# Patient Record
Sex: Male | Born: 1975
Health system: Southern US, Community
[De-identification: ages and names within clinical notes are randomized; demographics above are authoritative.]

## PROBLEM LIST (undated history)

## (undated) DIAGNOSIS — K219 Gastro-esophageal reflux disease without esophagitis: Secondary | ICD-10-CM

## (undated) DIAGNOSIS — R0681 Apnea, not elsewhere classified: Secondary | ICD-10-CM

## (undated) DIAGNOSIS — E785 Hyperlipidemia, unspecified: Secondary | ICD-10-CM

## (undated) DIAGNOSIS — E039 Hypothyroidism, unspecified: Secondary | ICD-10-CM

## (undated) DIAGNOSIS — F32A Depression, unspecified: Secondary | ICD-10-CM

## (undated) DIAGNOSIS — F329 Major depressive disorder, single episode, unspecified: Secondary | ICD-10-CM

## (undated) DIAGNOSIS — G4730 Sleep apnea, unspecified: Secondary | ICD-10-CM

## (undated) DIAGNOSIS — E119 Type 2 diabetes mellitus without complications: Secondary | ICD-10-CM

## (undated) DIAGNOSIS — J45909 Unspecified asthma, uncomplicated: Secondary | ICD-10-CM

## (undated) DIAGNOSIS — I10 Essential (primary) hypertension: Secondary | ICD-10-CM

## (undated) DIAGNOSIS — M1990 Unspecified osteoarthritis, unspecified site: Secondary | ICD-10-CM

## (undated) DIAGNOSIS — R011 Cardiac murmur, unspecified: Secondary | ICD-10-CM

## (undated) DIAGNOSIS — Z87442 Personal history of urinary calculi: Secondary | ICD-10-CM

## (undated) HISTORY — PX: CHOLECYSTECTOMY: SHX55

## (undated) HISTORY — DX: Hyperlipidemia, unspecified: E78.5

## (undated) HISTORY — DX: Gastro-esophageal reflux disease without esophagitis: K21.9

## (undated) HISTORY — PX: APPENDECTOMY: SHX54

## (undated) HISTORY — DX: Essential (primary) hypertension: I10

## (undated) HISTORY — DX: Type 2 diabetes mellitus without complications: E11.9

## (undated) HISTORY — DX: Apnea, not elsewhere classified: R06.81

## (undated) HISTORY — PX: OTHER SURGICAL HISTORY: SHX169

## (undated) HISTORY — PX: SMALL INTESTINE SURGERY: SHX150

---

## 2016-11-22 ENCOUNTER — Other Ambulatory Visit (HOSPITAL_COMMUNITY): Payer: Self-pay | Admitting: General Surgery

## 2016-12-13 ENCOUNTER — Ambulatory Visit (HOSPITAL_COMMUNITY)
Admission: RE | Admit: 2016-12-13 | Discharge: 2016-12-13 | Disposition: A | Discharge status: Home / Self Care | Payer: Medicare Other | Source: Ambulatory Visit | Attending: General Surgery | Admitting: General Surgery

## 2016-12-13 DIAGNOSIS — K449 Diaphragmatic hernia without obstruction or gangrene: Secondary | ICD-10-CM | POA: Diagnosis not present

## 2016-12-13 DIAGNOSIS — I4510 Unspecified right bundle-branch block: Secondary | ICD-10-CM | POA: Insufficient documentation

## 2016-12-18 ENCOUNTER — Encounter (INDEPENDENT_AMBULATORY_CARE_PROVIDER_SITE_OTHER): Payer: Self-pay

## 2016-12-18 ENCOUNTER — Encounter: Payer: Medicare Other | Attending: General Surgery | Admitting: Skilled Nursing Facility1

## 2016-12-18 ENCOUNTER — Encounter: Payer: Self-pay | Admitting: Skilled Nursing Facility1

## 2016-12-18 DIAGNOSIS — Z8261 Family history of arthritis: Secondary | ICD-10-CM | POA: Diagnosis not present

## 2016-12-18 DIAGNOSIS — M1990 Unspecified osteoarthritis, unspecified site: Secondary | ICD-10-CM | POA: Diagnosis not present

## 2016-12-18 DIAGNOSIS — Z8349 Family history of other endocrine, nutritional and metabolic diseases: Secondary | ICD-10-CM | POA: Insufficient documentation

## 2016-12-18 DIAGNOSIS — Z87891 Personal history of nicotine dependence: Secondary | ICD-10-CM | POA: Insufficient documentation

## 2016-12-18 DIAGNOSIS — Z836 Family history of other diseases of the respiratory system: Secondary | ICD-10-CM | POA: Diagnosis not present

## 2016-12-18 DIAGNOSIS — E119 Type 2 diabetes mellitus without complications: Secondary | ICD-10-CM | POA: Insufficient documentation

## 2016-12-18 DIAGNOSIS — R011 Cardiac murmur, unspecified: Secondary | ICD-10-CM | POA: Insufficient documentation

## 2016-12-18 DIAGNOSIS — K8020 Calculus of gallbladder without cholecystitis without obstruction: Secondary | ICD-10-CM | POA: Insufficient documentation

## 2016-12-18 DIAGNOSIS — Z8371 Family history of colonic polyps: Secondary | ICD-10-CM | POA: Insufficient documentation

## 2016-12-18 DIAGNOSIS — I10 Essential (primary) hypertension: Secondary | ICD-10-CM | POA: Insufficient documentation

## 2016-12-18 DIAGNOSIS — Z713 Dietary counseling and surveillance: Secondary | ICD-10-CM | POA: Insufficient documentation

## 2016-12-18 DIAGNOSIS — G4730 Sleep apnea, unspecified: Secondary | ICD-10-CM | POA: Insufficient documentation

## 2016-12-18 DIAGNOSIS — Z8249 Family history of ischemic heart disease and other diseases of the circulatory system: Secondary | ICD-10-CM | POA: Diagnosis not present

## 2016-12-18 DIAGNOSIS — N200 Calculus of kidney: Secondary | ICD-10-CM | POA: Insufficient documentation

## 2016-12-18 DIAGNOSIS — Z79899 Other long term (current) drug therapy: Secondary | ICD-10-CM | POA: Insufficient documentation

## 2016-12-18 DIAGNOSIS — E079 Disorder of thyroid, unspecified: Secondary | ICD-10-CM | POA: Insufficient documentation

## 2016-12-18 DIAGNOSIS — J45909 Unspecified asthma, uncomplicated: Secondary | ICD-10-CM | POA: Diagnosis not present

## 2016-12-18 DIAGNOSIS — Z811 Family history of alcohol abuse and dependence: Secondary | ICD-10-CM | POA: Diagnosis not present

## 2016-12-18 DIAGNOSIS — Z6841 Body Mass Index (BMI) 40.0 and over, adult: Secondary | ICD-10-CM | POA: Insufficient documentation

## 2016-12-18 DIAGNOSIS — M549 Dorsalgia, unspecified: Secondary | ICD-10-CM | POA: Diagnosis not present

## 2016-12-18 DIAGNOSIS — K219 Gastro-esophageal reflux disease without esophagitis: Secondary | ICD-10-CM | POA: Diagnosis not present

## 2016-12-18 DIAGNOSIS — F329 Major depressive disorder, single episode, unspecified: Secondary | ICD-10-CM | POA: Diagnosis not present

## 2016-12-18 DIAGNOSIS — K649 Unspecified hemorrhoids: Secondary | ICD-10-CM | POA: Diagnosis not present

## 2016-12-18 DIAGNOSIS — Z818 Family history of other mental and behavioral disorders: Secondary | ICD-10-CM | POA: Insufficient documentation

## 2016-12-18 DIAGNOSIS — Z9049 Acquired absence of other specified parts of digestive tract: Secondary | ICD-10-CM | POA: Insufficient documentation

## 2016-12-18 DIAGNOSIS — E7800 Pure hypercholesterolemia, unspecified: Secondary | ICD-10-CM | POA: Diagnosis not present

## 2016-12-18 DIAGNOSIS — Z9889 Other specified postprocedural states: Secondary | ICD-10-CM | POA: Diagnosis not present

## 2016-12-18 NOTE — Progress Notes (Signed)
Pre-Op Assessment Visit:  Pre-Operative RYGB Surgery  Medical Nutrition Therapy:  Appt start time: 2:03  End time:  2:50  Patient was seen on 12/18/2016 for Pre-Operative Nutrition Assessment. Assessment and letter of approval faxed to [facility] Surgery Bariatric Surgery Program coordinator on 12/18/2016.  Pt states he had tried a no carb diet and got very sick. Pt states he has had diabetes for many years. Pt states he checks his blood sugar and never gets over 120. Pt states he is aware of what low blood sugar feels like. Pt states sometimes he does not eat and gets low blood sugar.Pt states he has OCD.   Start weight at [facility]: 352.1 BMI: 56.83  24 hr Dietary Recall: First Meal: 2-3 eggs, toast, banana Snack:  Second Meal: grilled sandwich and vegetables  Snack: apple Third Meal: vegetables and chicken or tuna Snack:  Beverages: sweet tea, water, coffee  Encouraged to engage in 150 minutes of moderate physical activity including cardiovascular and weight baring weekly  Handouts given during visit include:  . Pre-Op Goals . Bariatric Surgery Protein Shakes During the appointment today the following Pre-Op Goals were reviewed with the patient: . Maintain or lose weight as instructed by your surgeon . Make healthy food choices . Begin to limit portion sizes . Limited concentrated sugars and fried foods . Keep fat/sugar in the single digits per serving on             food labels . Practice CHEWING your food  (aim for 30 chews per bite or until applesauce consistency) . Practice not drinking 15 minutes before, during, and 30 minutes after each meal/snack . Avoid all carbonated beverages  . Avoid/limit caffeinated beverages  . Avoid all sugar-sweetened beverages . Consume 3 meals per day; eat every 3-5 hours . Make a list of non-food related activities . Aim for 64-100 ounces of FLUID daily  . Aim for at least 60-80 grams of PROTEIN daily . Look for a liquid protein source  that contain ?15 g protein and ?5 g carbohydrate  (ex: shakes, drinks, shots)  -Follow diet recommendations listed below   Energy and Macronutrient Recomendations: Calories: 1800 Carbohydrate: 200 Protein: 135 Fat: 50  Demonstrated degree of understanding via:  Teach Back  Teaching Method Utilized:  Visual Auditory Hands on  Barriers to learning/adherence to lifestyle change: none identified   Patient to call the [facility] to enroll in Pre-Op and Post-Op Nutrition Education when surgery date is scheduled.

## 2016-12-18 NOTE — Patient Instructions (Signed)
Follow Pre-Op Goals Try Protein Shakes Call [facility] at [phone] when surgery is scheduled to enroll in Pre-Op Class  Things to remember:  Please always be honest with [place]. We want to support you!  If you have any questions or concerns in between appointments, please call or email   The diet after surgery will be high protein and low in carbohydrate.  Vitamins and calcium need to be taken for the rest of your life.  Feel free to include support people in any classes or appointments.   Supplement recommendations:  Before Surgery   1 Complete Multivitamin with Iron  3000 IU Vitamin D3  After Surgery   2 Chewable Multivitamins  **Best Choice - Opurity: Bypass and Sleeve Optimized Chewable    Bariatric Advantage Advanced Multi EA      3 Chewable Calcium (500 mg each, total 1200-1500 mg per day)  **Best Choice - Celebrate, Bariatric Advantage, or Wellesse  Other Options:  2 Celebrate MultiComplete with 18 mg Iron (this provides 6000 IU of  Vitamin D3)  4 Celebrate Essential Multi 2 in 1 (has calcium) + 18-60 mg separate  iron  Vitamins and Calcium are available at:   [facility]   [address], [place], [place] [address]   [email or web address]  www.[email or web address]  www.amazon.com

## 2017-01-01 ENCOUNTER — Encounter: Payer: Medicare Other | Attending: General Surgery | Admitting: Skilled Nursing Facility1

## 2017-01-01 DIAGNOSIS — R011 Cardiac murmur, unspecified: Secondary | ICD-10-CM | POA: Insufficient documentation

## 2017-01-01 DIAGNOSIS — N200 Calculus of kidney: Secondary | ICD-10-CM | POA: Insufficient documentation

## 2017-01-01 DIAGNOSIS — Z836 Family history of other diseases of the respiratory system: Secondary | ICD-10-CM | POA: Insufficient documentation

## 2017-01-01 DIAGNOSIS — Z818 Family history of other mental and behavioral disorders: Secondary | ICD-10-CM | POA: Diagnosis not present

## 2017-01-01 DIAGNOSIS — M1990 Unspecified osteoarthritis, unspecified site: Secondary | ICD-10-CM | POA: Insufficient documentation

## 2017-01-01 DIAGNOSIS — Z8349 Family history of other endocrine, nutritional and metabolic diseases: Secondary | ICD-10-CM | POA: Insufficient documentation

## 2017-01-01 DIAGNOSIS — Z8249 Family history of ischemic heart disease and other diseases of the circulatory system: Secondary | ICD-10-CM | POA: Diagnosis not present

## 2017-01-01 DIAGNOSIS — K219 Gastro-esophageal reflux disease without esophagitis: Secondary | ICD-10-CM | POA: Diagnosis not present

## 2017-01-01 DIAGNOSIS — Z9049 Acquired absence of other specified parts of digestive tract: Secondary | ICD-10-CM | POA: Diagnosis not present

## 2017-01-01 DIAGNOSIS — G4730 Sleep apnea, unspecified: Secondary | ICD-10-CM | POA: Insufficient documentation

## 2017-01-01 DIAGNOSIS — Z8371 Family history of colonic polyps: Secondary | ICD-10-CM | POA: Diagnosis not present

## 2017-01-01 DIAGNOSIS — J45909 Unspecified asthma, uncomplicated: Secondary | ICD-10-CM | POA: Diagnosis not present

## 2017-01-01 DIAGNOSIS — Z87891 Personal history of nicotine dependence: Secondary | ICD-10-CM | POA: Insufficient documentation

## 2017-01-01 DIAGNOSIS — E079 Disorder of thyroid, unspecified: Secondary | ICD-10-CM | POA: Insufficient documentation

## 2017-01-01 DIAGNOSIS — E119 Type 2 diabetes mellitus without complications: Secondary | ICD-10-CM | POA: Diagnosis not present

## 2017-01-01 DIAGNOSIS — Z9889 Other specified postprocedural states: Secondary | ICD-10-CM | POA: Insufficient documentation

## 2017-01-01 DIAGNOSIS — E7800 Pure hypercholesterolemia, unspecified: Secondary | ICD-10-CM | POA: Diagnosis not present

## 2017-01-01 DIAGNOSIS — Z713 Dietary counseling and surveillance: Secondary | ICD-10-CM | POA: Insufficient documentation

## 2017-01-01 DIAGNOSIS — K649 Unspecified hemorrhoids: Secondary | ICD-10-CM | POA: Diagnosis not present

## 2017-01-01 DIAGNOSIS — Z811 Family history of alcohol abuse and dependence: Secondary | ICD-10-CM | POA: Insufficient documentation

## 2017-01-01 DIAGNOSIS — Z8261 Family history of arthritis: Secondary | ICD-10-CM | POA: Diagnosis not present

## 2017-01-01 DIAGNOSIS — M549 Dorsalgia, unspecified: Secondary | ICD-10-CM | POA: Diagnosis not present

## 2017-01-01 DIAGNOSIS — K8020 Calculus of gallbladder without cholecystitis without obstruction: Secondary | ICD-10-CM | POA: Insufficient documentation

## 2017-01-01 DIAGNOSIS — Z6841 Body Mass Index (BMI) 40.0 and over, adult: Secondary | ICD-10-CM | POA: Insufficient documentation

## 2017-01-01 DIAGNOSIS — I10 Essential (primary) hypertension: Secondary | ICD-10-CM | POA: Diagnosis not present

## 2017-01-01 DIAGNOSIS — Z79899 Other long term (current) drug therapy: Secondary | ICD-10-CM | POA: Insufficient documentation

## 2017-01-01 DIAGNOSIS — F329 Major depressive disorder, single episode, unspecified: Secondary | ICD-10-CM | POA: Insufficient documentation

## 2017-01-01 DIAGNOSIS — E669 Obesity, unspecified: Secondary | ICD-10-CM

## 2017-01-03 ENCOUNTER — Encounter: Payer: Self-pay | Admitting: Skilled Nursing Facility1

## 2017-01-03 NOTE — Progress Notes (Signed)
  Pre-Operative Nutrition Class:  Appt start time: 6333   End time:  1830.  Patient was seen on 01/01/2017 for Pre-Operative Bariatric Surgery Education at [facility].   Surgery date:  Surgery type:  Start weight at [facility]: 353.2lbs Weight today: 349.3lbs  TANITA  BODY COMP RESULTS     BMI (kg/m^2)    Fat Mass (lbs)    Fat Free Mass (lbs)    Total Body Water (lbs)    Samples given per MNT protocol. Patient educated on appropriate usage: Bariatric Advantage Multivitamin Lot # [id number] Exp: 6/19  Celebrate Vitamins Calcium Citrate Lot # [id number] Exp:10/19  Unjury Protein  Lot # [id number] Exp: sep-16-18  The following the learning objectives were met by the patient during this course:  Identify Pre-Op Dietary Goals and will begin 2 weeks pre-operatively  Identify appropriate sources of fluids and proteins   State protein recommendations and appropriate sources pre and post-operatively  Identify Post-Operative Dietary Goals and will follow for 2 weeks post-operatively  Identify appropriate multivitamin and calcium sources  Describe the need for physical activity post-operatively and will follow MD recommendations  State when to call healthcare provider regarding medication questions or post-operative complications  Handouts given during class include:  Pre-Op Bariatric Surgery Diet Handout  Protein Shake Handout  Post-Op Bariatric Surgery Nutrition Handout  BELT Program Information Flyer  Support Group Information Flyer  [facility] Bariatric Supplements Price List  Follow-Up Plan: [facility] per MD.

## 2017-02-05 ENCOUNTER — Ambulatory Visit (INDEPENDENT_AMBULATORY_CARE_PROVIDER_SITE_OTHER): Payer: Medicare Other | Admitting: Psychology

## 2017-02-05 DIAGNOSIS — F3342 Major depressive disorder, recurrent, in full remission: Secondary | ICD-10-CM

## 2017-02-12 ENCOUNTER — Ambulatory Visit (INDEPENDENT_AMBULATORY_CARE_PROVIDER_SITE_OTHER): Payer: Medicare Other | Admitting: Psychology

## 2017-03-02 ENCOUNTER — Ambulatory Visit: Payer: Self-pay | Admitting: General Surgery

## 2017-03-13 NOTE — Patient Instructions (Signed)
DARON STUTZ  03/13/2017   Your procedure is scheduled on: 03/19/2017    Report to [facility] Main  Entrance Take [place]  elevators to 3rd floor to  [place] at    [place] AM.     Call this number if you have problems the morning of surgery [phone]    Remember: ONLY 1 PERSON MAY GO WITH YOU TO SHORT STAY TO GET  READY MORNING OF YOUR SURGERY.  Do not eat food or drink liquids :After Midnight.     Take these medicines the morning of surgery with A SIP OF WATER: Albuterol Inhaler if needed and bring, Wellbutrin, Flonase, Lamictal, synthroid, PRotonix  DO NOT TAKE ANY DIABETIC MEDICATIONS DAY OF YOUR SURGERY                               You may not have any metal on your body including hair pins and              piercings  Do not wear jewelry, make-up, lotions, powders or perfumes, deodorant             Do not wear nail polish.  Do not shave  48 hours prior to surgery.              Men may shave face and neck.   Do not bring valuables to the hospital. [place].  Contacts, dentures or bridgework may not be worn into surgery.  Leave suitcase in the car. After surgery it may be brought to your room.       Special Instructions: coughing and deep breathing exercises, leg exercises               Please read over the following fact sheets you were given: _____________________________________________________________________             [facility] - Preparing for Surgery Before surgery, you can play an important role.  Because skin is not sterile, your skin needs to be as free of germs as possible.  You can reduce the number of germs on your skin by washing with CHG (chlorahexidine gluconate) soap before surgery.  CHG is an antiseptic cleaner which kills germs and bonds with the skin to continue killing germs even after washing. Please DO NOT use if you have an allergy to CHG or antibacterial soaps.   If your skin becomes reddened/irritated stop using the CHG and inform your nurse when you arrive at Short Stay. Do not shave (including legs and underarms) for at least 48 hours prior to the first CHG shower.  You may shave your face/neck. Please follow these instructions carefully:  1.  Shower with CHG Soap the night before surgery and the  morning of Surgery.  2.  If you choose to wash your hair, wash your hair first as usual with your  normal  shampoo.  3.  After you shampoo, rinse your hair and body thoroughly to remove the  shampoo.                           4.  Use CHG as you would any other liquid soap.  You can apply chg directly  to the skin and wash                       Gently with a scrungie or clean washcloth.  5.  Apply the CHG Soap to your body ONLY FROM THE NECK DOWN.   Do not use on face/ open                           Wound or open sores. Avoid contact with eyes, ears mouth and genitals (private parts).                       Wash face,  Genitals (private parts) with your normal soap.             6.  Wash thoroughly, paying special attention to the area where your surgery  will be performed.  7.  Thoroughly rinse your body with warm water from the neck down.  8.  DO NOT shower/wash with your normal soap after using and rinsing off  the CHG Soap.                9.  Pat yourself dry with a clean towel.            10.  Wear clean pajamas.            11.  Place clean sheets on your bed the night of your first shower and do not  sleep with pets. Day of Surgery : Do not apply any lotions/deodorants the morning of surgery.  Please wear clean clothes to the hospital/surgery center.  FAILURE TO FOLLOW THESE INSTRUCTIONS MAY RESULT IN THE CANCELLATION OF YOUR SURGERY PATIENT SIGNATURE_________________________________  NURSE SIGNATURE__________________________________  ________________________________________________________________________

## 2017-03-15 ENCOUNTER — Encounter (HOSPITAL_COMMUNITY): Payer: Self-pay

## 2017-03-15 ENCOUNTER — Encounter (HOSPITAL_COMMUNITY)
Admission: RE | Admit: 2017-03-15 | Discharge: 2017-03-15 | Disposition: A | Discharge status: Home / Self Care | Payer: Medicare Other | Source: Ambulatory Visit | Attending: General Surgery | Admitting: General Surgery

## 2017-03-15 DIAGNOSIS — Z01812 Encounter for preprocedural laboratory examination: Secondary | ICD-10-CM | POA: Diagnosis present

## 2017-03-15 DIAGNOSIS — E119 Type 2 diabetes mellitus without complications: Secondary | ICD-10-CM | POA: Insufficient documentation

## 2017-03-15 HISTORY — DX: Unspecified asthma, uncomplicated: J45.909

## 2017-03-15 HISTORY — DX: Depression, unspecified: F32.A

## 2017-03-15 HISTORY — DX: Major depressive disorder, single episode, unspecified: F32.9

## 2017-03-15 HISTORY — DX: Hypothyroidism, unspecified: E03.9

## 2017-03-15 HISTORY — DX: Personal history of urinary calculi: Z87.442

## 2017-03-15 HISTORY — DX: Unspecified osteoarthritis, unspecified site: M19.90

## 2017-03-15 HISTORY — DX: Cardiac murmur, unspecified: R01.1

## 2017-03-15 HISTORY — DX: Sleep apnea, unspecified: G47.30

## 2017-03-15 LAB — COMPREHENSIVE METABOLIC PANEL
ALK PHOS: 91 U/L (ref 38–126)
ALT: 25 U/L (ref 17–63)
AST: 26 U/L (ref 15–41)
Albumin: 4 g/dL (ref 3.5–5.0)
Anion gap: 8 (ref 5–15)
BILIRUBIN TOTAL: 0.2 mg/dL — AB (ref 0.3–1.2)
BUN: 20 mg/dL (ref 6–20)
CALCIUM: 9.3 mg/dL (ref 8.9–10.3)
CO2: 31 mmol/L (ref 22–32)
CREATININE: 0.95 mg/dL (ref 0.61–1.24)
Chloride: 100 mmol/L — ABNORMAL LOW (ref 101–111)
Glucose, Bld: 128 mg/dL — ABNORMAL HIGH (ref 65–99)
Potassium: 4.7 mmol/L (ref 3.5–5.1)
Sodium: 139 mmol/L (ref 135–145)
Total Protein: 7.7 g/dL (ref 6.5–8.1)

## 2017-03-15 LAB — CBC WITH DIFFERENTIAL/PLATELET
Basophils Absolute: 0 10*3/uL (ref 0.0–0.1)
Basophils Relative: 0 %
EOS PCT: 2 %
Eosinophils Absolute: 0.2 10*3/uL (ref 0.0–0.7)
HEMATOCRIT: 38 % — AB (ref 39.0–52.0)
HEMOGLOBIN: 11.8 g/dL — AB (ref 13.0–17.0)
LYMPHS PCT: 26 %
Lymphs Abs: 3.4 10*3/uL (ref 0.7–4.0)
MCH: 22.3 pg — AB (ref 26.0–34.0)
MCHC: 31.1 g/dL (ref 30.0–36.0)
MCV: 72 fL — AB (ref 78.0–100.0)
Monocytes Absolute: 0.9 10*3/uL (ref 0.1–1.0)
Monocytes Relative: 7 %
NEUTROS ABS: 8.3 10*3/uL — AB (ref 1.7–7.7)
Neutrophils Relative %: 65 %
Platelets: 405 10*3/uL — ABNORMAL HIGH (ref 150–400)
RBC: 5.28 MIL/uL (ref 4.22–5.81)
RDW: 17.5 % — ABNORMAL HIGH (ref 11.5–15.5)
WBC: 12.9 10*3/uL — AB (ref 4.0–10.5)

## 2017-03-15 LAB — GLUCOSE, CAPILLARY: Glucose-Capillary: 131 mg/dL — ABNORMAL HIGH (ref 65–99)

## 2017-03-15 MED FILL — oxyCODONE HCL 5 MG/5ML SOLN: 5 | 4 days supply | Qty: 120 | Fill #0

## 2017-03-15 NOTE — Progress Notes (Signed)
CBC done 6/21/8 faxed via epic to Dr Lorraine [email or web address].r

## 2017-03-15 NOTE — Progress Notes (Signed)
EKG-12/13/16 in epic  and CXR 12/13/16-epic

## 2017-03-16 LAB — HEMOGLOBIN A1C
Hgb A1c MFr Bld: 7.6 % — ABNORMAL HIGH (ref 4.8–5.6)
MEAN PLASMA GLUCOSE: 171 mg/dL

## 2017-03-18 NOTE — Anesthesia Preprocedure Evaluation (Addendum)
Anesthesia Evaluation  Patient identified by MRN, date of birth, ID band Patient awake    Reviewed: Allergy & Precautions, H&P , Patient's Chart, lab work & pertinent test results, reviewed documented beta blocker date and time   Airway Mallampati: IV  TM Distance: >3 FB Neck ROM: full    Dental no notable dental hx.    Pulmonary former smoker,    Pulmonary exam normal breath sounds clear to auscultation       Cardiovascular hypertension,  Rhythm:regular Rate:Normal     Neuro/Psych    GI/Hepatic   Endo/Other  diabetes  Renal/GU      Musculoskeletal   Abdominal   Peds  Hematology   Anesthesia Other Findings GERD (gastroesophageal reflux disease)   Hyperlipidemia   Hypertension   Apnea   Heart murmur  as a child   Sleep apnea  cpap - setting at 17 for pressure  History of kidney stones    Asthma   Hypothyroidism    Diabetes mellitus type II  Depression    Arthritis IRBBB      Reproductive/Obstetrics                            Anesthesia Physical Anesthesia Plan  ASA: III  Anesthesia Plan: General   Post-op Pain Management:    Induction: Intravenous  PONV Risk Score and Plan: 1 and Ondansetron and Dexamethasone  Airway Management Planned: Oral ETT and Video Laryngoscope Planned  Additional Equipment:   Intra-op Plan:   Post-operative Plan: Extubation in OR  Informed Consent: I have reviewed the patients History and Physical, chart, labs and discussed the procedure including the risks, benefits and alternatives for the proposed anesthesia with the patient or authorized representative who has indicated his/her understanding and acceptance.   Dental Advisory Given  Plan Discussed with: CRNA and Surgeon  Anesthesia Plan Comments: (  )      Anesthesia Quick Evaluation

## 2017-03-19 ENCOUNTER — Inpatient Hospital Stay (HOSPITAL_COMMUNITY)
Admission: RE | Admit: 2017-03-19 | Discharge: 2017-03-20 | DRG: 621 | Disposition: A | Discharge status: Home / Self Care | Payer: Medicare Other | Source: Ambulatory Visit | Attending: General Surgery | Admitting: General Surgery

## 2017-03-19 ENCOUNTER — Inpatient Hospital Stay (HOSPITAL_COMMUNITY): Payer: Medicare Other | Admitting: Anesthesiology

## 2017-03-19 ENCOUNTER — Encounter (HOSPITAL_COMMUNITY): Payer: Self-pay | Admitting: *Deleted

## 2017-03-19 ENCOUNTER — Encounter (HOSPITAL_COMMUNITY)
Admission: RE | Disposition: A | Discharge status: Home / Self Care | Payer: Self-pay | Source: Ambulatory Visit | Attending: General Surgery

## 2017-03-19 DIAGNOSIS — Z87891 Personal history of nicotine dependence: Secondary | ICD-10-CM

## 2017-03-19 DIAGNOSIS — E039 Hypothyroidism, unspecified: Secondary | ICD-10-CM | POA: Diagnosis present

## 2017-03-19 DIAGNOSIS — Z7984 Long term (current) use of oral hypoglycemic drugs: Secondary | ICD-10-CM | POA: Diagnosis not present

## 2017-03-19 DIAGNOSIS — J45909 Unspecified asthma, uncomplicated: Secondary | ICD-10-CM | POA: Diagnosis present

## 2017-03-19 DIAGNOSIS — Z79899 Other long term (current) drug therapy: Secondary | ICD-10-CM

## 2017-03-19 DIAGNOSIS — K219 Gastro-esophageal reflux disease without esophagitis: Secondary | ICD-10-CM | POA: Diagnosis present

## 2017-03-19 DIAGNOSIS — Z9989 Dependence on other enabling machines and devices: Secondary | ICD-10-CM

## 2017-03-19 DIAGNOSIS — E785 Hyperlipidemia, unspecified: Secondary | ICD-10-CM | POA: Diagnosis present

## 2017-03-19 DIAGNOSIS — G4730 Sleep apnea, unspecified: Secondary | ICD-10-CM | POA: Diagnosis present

## 2017-03-19 DIAGNOSIS — Z91040 Latex allergy status: Secondary | ICD-10-CM | POA: Diagnosis not present

## 2017-03-19 DIAGNOSIS — M1990 Unspecified osteoarthritis, unspecified site: Secondary | ICD-10-CM | POA: Diagnosis present

## 2017-03-19 DIAGNOSIS — I4510 Unspecified right bundle-branch block: Secondary | ICD-10-CM | POA: Diagnosis present

## 2017-03-19 DIAGNOSIS — F329 Major depressive disorder, single episode, unspecified: Secondary | ICD-10-CM | POA: Diagnosis present

## 2017-03-19 DIAGNOSIS — I10 Essential (primary) hypertension: Secondary | ICD-10-CM | POA: Diagnosis present

## 2017-03-19 DIAGNOSIS — Z886 Allergy status to analgesic agent status: Secondary | ICD-10-CM

## 2017-03-19 DIAGNOSIS — Z881 Allergy status to other antibiotic agents status: Secondary | ICD-10-CM | POA: Diagnosis not present

## 2017-03-19 DIAGNOSIS — Z6841 Body Mass Index (BMI) 40.0 and over, adult: Secondary | ICD-10-CM | POA: Diagnosis not present

## 2017-03-19 DIAGNOSIS — K449 Diaphragmatic hernia without obstruction or gangrene: Secondary | ICD-10-CM | POA: Diagnosis present

## 2017-03-19 DIAGNOSIS — E119 Type 2 diabetes mellitus without complications: Secondary | ICD-10-CM | POA: Diagnosis present

## 2017-03-19 HISTORY — PX: GASTRIC ROUX-EN-Y: SHX5262

## 2017-03-19 LAB — GLUCOSE, CAPILLARY
GLUCOSE-CAPILLARY: 214 mg/dL — AB (ref 65–99)
Glucose-Capillary: 141 mg/dL — ABNORMAL HIGH (ref 65–99)
Glucose-Capillary: 176 mg/dL — ABNORMAL HIGH (ref 65–99)

## 2017-03-19 LAB — HEMOGLOBIN AND HEMATOCRIT, BLOOD
HEMATOCRIT: 39.1 % (ref 39.0–52.0)
HEMOGLOBIN: 12 g/dL — AB (ref 13.0–17.0)

## 2017-03-19 SURGERY — LAPAROSCOPIC ROUX-EN-Y GASTRIC BYPASS WITH UPPER ENDOSCOPY
Anesthesia: General | Site: Abdomen

## 2017-03-19 MED ORDER — HEPARIN SODIUM (PORCINE) 5000 UNIT/ML IJ SOLN
5000.0000 [IU] | INTRAMUSCULAR | Status: AC
  Administered 2017-03-19: 5000 [IU] via SUBCUTANEOUS
  Filled 2017-03-19: qty 1

## 2017-03-19 MED ORDER — LACTATED RINGERS IV SOLN
INTRAVENOUS | Status: DC | PRN
  Administered 2017-03-19 (×3): via INTRAVENOUS

## 2017-03-19 MED ORDER — FENTANYL CITRATE (PF) 100 MCG/2ML IJ SOLN
INTRAMUSCULAR | Status: AC
  Administered 2017-03-19: 50 ug via INTRAVENOUS
  Filled 2017-03-19: qty 2

## 2017-03-19 MED ORDER — PANTOPRAZOLE SODIUM 40 MG IV SOLR
40.0000 mg | Freq: Every day | INTRAVENOUS | Status: DC
  Administered 2017-03-19: 40 mg via INTRAVENOUS
  Filled 2017-03-19: qty 40

## 2017-03-19 MED ORDER — ENOXAPARIN SODIUM 30 MG/0.3ML ~~LOC~~ SOLN
30.0000 mg | Freq: Two times a day (BID) | SUBCUTANEOUS | Status: DC
  Administered 2017-03-20: 30 mg via SUBCUTANEOUS
  Filled 2017-03-19: qty 0.3

## 2017-03-19 MED ORDER — BUPIVACAINE-EPINEPHRINE (PF) 0.25% -1:200000 IJ SOLN
INTRAMUSCULAR | Status: AC
  Filled 2017-03-19: qty 30

## 2017-03-19 MED ORDER — LABETALOL HCL 5 MG/ML IV SOLN
INTRAVENOUS | Status: AC
  Administered 2017-03-19: 10 mg via INTRAVENOUS
  Filled 2017-03-19: qty 4

## 2017-03-19 MED ORDER — SUGAMMADEX SODIUM 500 MG/5ML IV SOLN
INTRAVENOUS | Status: AC
  Filled 2017-03-19: qty 5

## 2017-03-19 MED ORDER — PHENYLEPHRINE HCL 10 MG/ML IJ SOLN
INTRAMUSCULAR | Status: DC | PRN
  Administered 2017-03-19: 240 ug via INTRAVENOUS
  Administered 2017-03-19: 200 ug via INTRAVENOUS

## 2017-03-19 MED ORDER — ONDANSETRON HCL 4 MG/2ML IJ SOLN: 4.0000 mg | INTRAMUSCULAR | Status: DC | PRN

## 2017-03-19 MED ORDER — BUPIVACAINE HCL 0.25 % IJ SOLN
INTRAMUSCULAR | Status: DC | PRN
  Administered 2017-03-19: 30 mL

## 2017-03-19 MED ORDER — PHENYLEPHRINE 40 MCG/ML (10ML) SYRINGE FOR IV PUSH (FOR BLOOD PRESSURE SUPPORT)
PREFILLED_SYRINGE | INTRAVENOUS | Status: DC | PRN
  Administered 2017-03-19 (×3): 80 ug via INTRAVENOUS

## 2017-03-19 MED ORDER — CHLORHEXIDINE GLUCONATE 4 % EX LIQD: 60.0000 mL | Freq: Once | CUTANEOUS | Status: DC

## 2017-03-19 MED ORDER — LIDOCAINE 2% (20 MG/ML) 5 ML SYRINGE
INTRAMUSCULAR | Status: DC | PRN
  Administered 2017-03-19: 1.5 mg/kg/h via INTRAVENOUS

## 2017-03-19 MED ORDER — BUPIVACAINE LIPOSOME 1.3 % IJ SUSP
Freq: Once | INTRAMUSCULAR | Status: AC
  Administered 2017-03-19: 20 mL
  Filled 2017-03-19: qty 20

## 2017-03-19 MED ORDER — INSULIN ASPART 100 UNIT/ML ~~LOC~~ SOLN
0.0000 [IU] | Freq: Three times a day (TID) | SUBCUTANEOUS | Status: DC
  Administered 2017-03-20: 3 [IU] via SUBCUTANEOUS

## 2017-03-19 MED ORDER — ALBUTEROL SULFATE (2.5 MG/3ML) 0.083% IN NEBU
3.0000 mL | INHALATION_SOLUTION | Freq: Four times a day (QID) | RESPIRATORY_TRACT | Status: DC | PRN
Start: 2017-03-19 — End: 2017-03-20

## 2017-03-19 MED ORDER — KETAMINE HCL 10 MG/ML IJ SOLN
INTRAMUSCULAR | Status: DC | PRN
  Administered 2017-03-19: 40 mg via INTRAVENOUS

## 2017-03-19 MED ORDER — FENTANYL CITRATE (PF) 100 MCG/2ML IJ SOLN: 25.0000 ug | INTRAMUSCULAR | Status: DC | PRN

## 2017-03-19 MED ORDER — LIDOCAINE 2% (20 MG/ML) 5 ML SYRINGE
INTRAMUSCULAR | Status: AC
  Filled 2017-03-19: qty 15

## 2017-03-19 MED ORDER — PROPOFOL 10 MG/ML IV BOLUS
INTRAVENOUS | Status: AC
  Filled 2017-03-19: qty 40

## 2017-03-19 MED ORDER — PROPOFOL 10 MG/ML IV BOLUS
INTRAVENOUS | Status: DC | PRN
  Administered 2017-03-19: 200 mg via INTRAVENOUS

## 2017-03-19 MED ORDER — CEFOTETAN DISODIUM-DEXTROSE 2-2.08 GM-% IV SOLR
INTRAVENOUS | Status: AC
  Filled 2017-03-19: qty 50

## 2017-03-19 MED ORDER — FENTANYL CITRATE (PF) 250 MCG/5ML IJ SOLN
INTRAMUSCULAR | Status: AC
  Filled 2017-03-19: qty 5

## 2017-03-19 MED ORDER — GABAPENTIN 300 MG PO CAPS
300.0000 mg | ORAL_CAPSULE | ORAL | Status: AC
  Administered 2017-03-19: 300 mg via ORAL
  Filled 2017-03-19: qty 1

## 2017-03-19 MED ORDER — MORPHINE SULFATE (PF) 2 MG/ML IV SOLN
1.0000 mg | INTRAVENOUS | Status: DC | PRN
  Administered 2017-03-19: 2 mg via INTRAVENOUS
  Administered 2017-03-19 (×2): 3 mg via INTRAVENOUS
  Administered 2017-03-19: 1 mg via INTRAVENOUS
  Filled 2017-03-19 (×2): qty 1
  Filled 2017-03-19 (×2): qty 2

## 2017-03-19 MED ORDER — TRAMADOL HCL 50 MG PO TABS: 50.0000 mg | ORAL_TABLET | Freq: Four times a day (QID) | ORAL | Status: DC | PRN

## 2017-03-19 MED ORDER — OXYCODONE HCL 5 MG/5ML PO SOLN
5.0000 mg | ORAL | Status: DC | PRN
  Administered 2017-03-19: 5 mg via ORAL
  Administered 2017-03-20 (×4): 10 mg via ORAL
  Filled 2017-03-19 (×2): qty 10
  Filled 2017-03-19: qty 5
  Filled 2017-03-19 (×2): qty 10

## 2017-03-19 MED ORDER — SUGAMMADEX SODIUM 200 MG/2ML IV SOLN
INTRAVENOUS | Status: DC | PRN
Start: 2017-03-19 — End: 2017-03-19
  Administered 2017-03-19: 500 mg via INTRAVENOUS

## 2017-03-19 MED ORDER — MIDAZOLAM HCL 2 MG/2ML IJ SOLN
INTRAMUSCULAR | Status: AC
Start: 2017-03-19 — End: 2017-03-19
  Filled 2017-03-19: qty 2

## 2017-03-19 MED ORDER — ROCURONIUM BROMIDE 50 MG/5ML IV SOSY
PREFILLED_SYRINGE | INTRAVENOUS | Status: AC
  Filled 2017-03-19: qty 5

## 2017-03-19 MED ORDER — EPHEDRINE SULFATE 50 MG/ML IJ SOLN
INTRAMUSCULAR | Status: DC | PRN
  Administered 2017-03-19: 15 mg via INTRAVENOUS
  Administered 2017-03-19: 20 mg via INTRAVENOUS

## 2017-03-19 MED ORDER — PHENYLEPHRINE 40 MCG/ML (10ML) SYRINGE FOR IV PUSH (FOR BLOOD PRESSURE SUPPORT)
PREFILLED_SYRINGE | INTRAVENOUS | Status: AC
  Filled 2017-03-19: qty 10

## 2017-03-19 MED ORDER — FENTANYL CITRATE (PF) 250 MCG/5ML IJ SOLN
INTRAMUSCULAR | Status: DC | PRN
  Administered 2017-03-19: 150 ug via INTRAVENOUS
  Administered 2017-03-19: 100 ug via INTRAVENOUS
  Administered 2017-03-19 (×3): 50 ug via INTRAVENOUS

## 2017-03-19 MED ORDER — ONDANSETRON HCL 4 MG/2ML IJ SOLN
INTRAMUSCULAR | Status: AC
  Filled 2017-03-19: qty 2

## 2017-03-19 MED ORDER — EPHEDRINE 5 MG/ML INJ
INTRAVENOUS | Status: AC
  Filled 2017-03-19: qty 10

## 2017-03-19 MED ORDER — ACETAMINOPHEN 500 MG PO TABS
1000.0000 mg | ORAL_TABLET | ORAL | Status: AC
  Administered 2017-03-19: 1000 mg via ORAL
  Filled 2017-03-19: qty 2

## 2017-03-19 MED ORDER — ROCURONIUM BROMIDE 10 MG/ML (PF) SYRINGE
PREFILLED_SYRINGE | INTRAVENOUS | Status: DC | PRN
  Administered 2017-03-19: 20 mg via INTRAVENOUS
  Administered 2017-03-19: 10 mg via INTRAVENOUS
  Administered 2017-03-19: 20 mg via INTRAVENOUS
  Administered 2017-03-19: 50 mg via INTRAVENOUS
  Administered 2017-03-19: 20 mg via INTRAVENOUS

## 2017-03-19 MED ORDER — 0.9 % SODIUM CHLORIDE (POUR BTL) OPTIME
TOPICAL | Status: DC | PRN
  Administered 2017-03-19: 1000 mL

## 2017-03-19 MED ORDER — LACTATED RINGERS IR SOLN
Status: DC | PRN
  Administered 2017-03-19: 1000 mL

## 2017-03-19 MED ORDER — FENTANYL CITRATE (PF) 100 MCG/2ML IJ SOLN
25.0000 ug | INTRAMUSCULAR | Status: DC | PRN
  Administered 2017-03-19 (×2): 25 ug via INTRAVENOUS
  Administered 2017-03-19 (×3): 50 ug via INTRAVENOUS

## 2017-03-19 MED ORDER — ACETAMINOPHEN 160 MG/5ML PO SOLN: 325.0000 mg | ORAL | Status: DC | PRN

## 2017-03-19 MED ORDER — ONDANSETRON HCL 4 MG/2ML IJ SOLN
INTRAMUSCULAR | Status: DC | PRN
  Administered 2017-03-19: 4 mg via INTRAVENOUS

## 2017-03-19 MED ORDER — ACETAMINOPHEN 325 MG PO TABS: 650.0000 mg | ORAL_TABLET | ORAL | Status: DC | PRN

## 2017-03-19 MED ORDER — SIMETHICONE 80 MG PO CHEW: 80.0000 mg | CHEWABLE_TABLET | Freq: Four times a day (QID) | ORAL | Status: DC | PRN

## 2017-03-19 MED ORDER — PREMIER PROTEIN SHAKE: 2.0000 [oz_av] | ORAL | Status: DC

## 2017-03-19 MED ORDER — DEXAMETHASONE SODIUM PHOSPHATE 4 MG/ML IJ SOLN
4.0000 mg | INTRAMUSCULAR | Status: AC
  Administered 2017-03-19: 8 mg via INTRAVENOUS

## 2017-03-19 MED ORDER — CEFOTETAN DISODIUM-DEXTROSE 2-2.08 GM-% IV SOLR
2.0000 g | INTRAVENOUS | Status: AC
  Administered 2017-03-19: 2 g via INTRAVENOUS

## 2017-03-19 MED ORDER — APREPITANT 40 MG PO CAPS
40.0000 mg | ORAL_CAPSULE | ORAL | Status: AC
  Administered 2017-03-19: 40 mg via ORAL
  Filled 2017-03-19: qty 1

## 2017-03-19 MED ORDER — LIDOCAINE 2% (20 MG/ML) 5 ML SYRINGE
INTRAMUSCULAR | Status: AC
  Filled 2017-03-19: qty 5

## 2017-03-19 MED ORDER — FENTANYL CITRATE (PF) 100 MCG/2ML IJ SOLN
INTRAMUSCULAR | Status: AC
  Administered 2017-03-19: 25 ug via INTRAVENOUS
  Filled 2017-03-19: qty 2

## 2017-03-19 MED ORDER — SCOPOLAMINE 1 MG/3DAYS TD PT72
1.0000 | MEDICATED_PATCH | TRANSDERMAL | Status: DC
  Administered 2017-03-19: 1.5 mg via TRANSDERMAL
  Filled 2017-03-19: qty 1

## 2017-03-19 MED ORDER — KETAMINE HCL 10 MG/ML IJ SOLN
INTRAMUSCULAR | Status: AC
  Filled 2017-03-19: qty 1

## 2017-03-19 MED ORDER — MIDAZOLAM HCL 5 MG/5ML IJ SOLN
INTRAMUSCULAR | Status: DC | PRN
  Administered 2017-03-19: 2 mg via INTRAVENOUS

## 2017-03-19 MED ORDER — DEXAMETHASONE SODIUM PHOSPHATE 10 MG/ML IJ SOLN
INTRAMUSCULAR | Status: AC
  Filled 2017-03-19: qty 1

## 2017-03-19 MED ORDER — ROCURONIUM BROMIDE 50 MG/5ML IV SOSY
PREFILLED_SYRINGE | INTRAVENOUS | Status: AC
  Filled 2017-03-19: qty 10

## 2017-03-19 MED ORDER — LABETALOL HCL 5 MG/ML IV SOLN
10.0000 mg | INTRAVENOUS | Status: DC | PRN
  Administered 2017-03-19: 10 mg via INTRAVENOUS
  Filled 2017-03-19: qty 4

## 2017-03-19 MED ORDER — SODIUM CHLORIDE 0.9 % IV SOLN
INTRAVENOUS | Status: DC
  Administered 2017-03-19: 15:00:00 via INTRAVENOUS

## 2017-03-19 MED ORDER — CELECOXIB 200 MG PO CAPS
400.0000 mg | ORAL_CAPSULE | ORAL | Status: AC
  Administered 2017-03-19: 400 mg via ORAL
  Filled 2017-03-19: qty 2

## 2017-03-19 MED ORDER — METOPROLOL TARTRATE 5 MG/5ML IV SOLN: 5.0000 mg | Freq: Four times a day (QID) | INTRAVENOUS | Status: DC | PRN

## 2017-03-19 SURGICAL SUPPLY — 71 items
APPLIER CLIP 5 13 M/L LIGAMAX5 (MISCELLANEOUS)
APPLIER CLIP ROT 10 11.4 M/L (STAPLE)
APPLIER CLIP ROT 13.4 12 LRG (CLIP) ×3
BANDAGE ADH SHEER 1  50/CT (GAUZE/BANDAGES/DRESSINGS) IMPLANT
BENZOIN TINCTURE PRP APPL 2/3 (GAUZE/BANDAGES/DRESSINGS) IMPLANT
BLADE SURG SZ11 CARB STEEL (BLADE) ×3 IMPLANT
CABLE HIGH FREQUENCY MONO STRZ (ELECTRODE) ×3 IMPLANT
CHLORAPREP W/TINT 26ML (MISCELLANEOUS) ×3 IMPLANT
CLIP APPLIE 5 13 M/L LIGAMAX5 (MISCELLANEOUS) IMPLANT
CLIP APPLIE ROT 10 11.4 M/L (STAPLE) IMPLANT
CLIP APPLIE ROT 13.4 12 LRG (CLIP) ×1 IMPLANT
CLOSURE WOUND 1/2 X4 (GAUZE/BANDAGES/DRESSINGS)
COVER SURGICAL LIGHT HANDLE (MISCELLANEOUS) ×3 IMPLANT
DEVICE SUTURE ENDOST 10MM (ENDOMECHANICALS) ×3 IMPLANT
DRAIN CHANNEL 19F RND (DRAIN) IMPLANT
DRAIN PENROSE 18X1/4 LTX STRL (WOUND CARE) ×3 IMPLANT
DRAIN PENROSE LF 8X20.3CM SIL (WOUND CARE) ×3 IMPLANT
ELECT L-HOOK LAP 45CM DISP (ELECTROSURGICAL) ×3
ELECT PENCIL ROCKER SW 15FT (MISCELLANEOUS) ×3 IMPLANT
ELECTRODE L-HOOK LAP 45CM DISP (ELECTROSURGICAL) ×1 IMPLANT
EVACUATOR SILICONE 100CC (DRAIN) IMPLANT
GAUZE SPONGE 4X4 12PLY STRL (GAUZE/BANDAGES/DRESSINGS) IMPLANT
GAUZE SPONGE 4X4 16PLY XRAY LF (GAUZE/BANDAGES/DRESSINGS) ×3 IMPLANT
GLOVE BIOGEL PI IND STRL 7.0 (GLOVE) ×1 IMPLANT
GLOVE BIOGEL PI INDICATOR 7.0 (GLOVE) ×2
GLOVE SURG SS PI 7.0 STRL IVOR (GLOVE) ×3 IMPLANT
GOWN STRL REUS W/TWL LRG LVL3 (GOWN DISPOSABLE) ×3 IMPLANT
GOWN STRL REUS W/TWL XL LVL3 (GOWN DISPOSABLE) ×9 IMPLANT
HANDLE STAPLE EGIA 4 XL (STAPLE) ×3 IMPLANT
HOLDER FOLEY CATH W/STRAP (MISCELLANEOUS) ×3 IMPLANT
HOVERMATT SINGLE USE (MISCELLANEOUS) ×3 IMPLANT
IRRIG SUCT STRYKERFLOW 2 WTIP (MISCELLANEOUS)
IRRIGATION SUCT STRKRFLW 2 WTP (MISCELLANEOUS) IMPLANT
KIT BASIN OR (CUSTOM PROCEDURE TRAY) ×3 IMPLANT
KIT GASTRIC LAVAGE 34FR ADT (SET/KITS/TRAYS/PACK) ×3 IMPLANT
MARKER SKIN DUAL TIP RULER LAB (MISCELLANEOUS) ×3 IMPLANT
NEEDLE SPNL 22GX3.5 QUINCKE BK (NEEDLE) ×3 IMPLANT
PACK CARDIOVASCULAR III (CUSTOM PROCEDURE TRAY) ×3 IMPLANT
RELOAD EGIA 45 MED/THCK PURPLE (STAPLE) ×3 IMPLANT
RELOAD EGIA 45 TAN VASC (STAPLE) IMPLANT
RELOAD EGIA 60 MED/THCK PURPLE (STAPLE) ×9 IMPLANT
RELOAD EGIA 60 TAN VASC (STAPLE) ×15 IMPLANT
RELOAD ENDO STITCH 2.0 (ENDOMECHANICALS) ×24
SCISSORS LAP 5X45 EPIX DISP (ENDOMECHANICALS) ×3 IMPLANT
SHEARS HARMONIC ACE PLUS 45CM (MISCELLANEOUS) ×3 IMPLANT
SLEEVE XCEL OPT CAN 5 100 (ENDOMECHANICALS) ×9 IMPLANT
SOLUTION ANTI FOG 6CC (MISCELLANEOUS) ×3 IMPLANT
STAPLER VISISTAT 35W (STAPLE) IMPLANT
STRIP CLOSURE SKIN 1/2X4 (GAUZE/BANDAGES/DRESSINGS) IMPLANT
SUT ETHILON 2 0 PS N (SUTURE) IMPLANT
SUT MNCRL AB 4-0 PS2 18 (SUTURE) ×6 IMPLANT
SUT RELOAD ENDO STITCH 2 48X1 (ENDOMECHANICALS) ×6
SUT RELOAD ENDO STITCH 2.0 (ENDOMECHANICALS) ×6
SUT SILK 0 SH 30 (SUTURE) IMPLANT
SUTURE RELOAD END STTCH 2 48X1 (ENDOMECHANICALS) ×6 IMPLANT
SUTURE RELOAD ENDO STITCH 2.0 (ENDOMECHANICALS) ×6 IMPLANT
SYR 20CC LL (SYRINGE) ×3 IMPLANT
SYR 50ML LL SCALE MARK (SYRINGE) ×3 IMPLANT
TOWEL OR 17X26 10 PK STRL BLUE (TOWEL DISPOSABLE) ×3 IMPLANT
TOWEL OR NON WOVEN STRL DISP B (DISPOSABLE) ×3 IMPLANT
TRAY FOLEY BAG SILVER LF 16FR (CATHETERS) ×3 IMPLANT
TRAY FOLEY W/METER SILVER 14FR (SET/KITS/TRAYS/PACK) IMPLANT
TRAY FOLEY W/METER SILVER 16FR (SET/KITS/TRAYS/PACK) IMPLANT
TROCAR BLADELESS OPT 5 100 (ENDOMECHANICALS) ×3 IMPLANT
TROCAR XCEL 12X100 BLDLESS (ENDOMECHANICALS) ×3 IMPLANT
TUBE CALIBRATION LAPBAND (TUBING) ×3 IMPLANT
TUBING CONNECTING 10 (TUBING) ×2 IMPLANT
TUBING CONNECTING 10' (TUBING) ×1
TUBING ENDO SMARTCAP PENTAX (MISCELLANEOUS) ×3 IMPLANT
TUBING INSUF HEATED (TUBING) ×3 IMPLANT
TUBING INSUFFLATION 10FT LAP (TUBING) ×3 IMPLANT

## 2017-03-19 NOTE — Transfer of Care (Signed)
Immediate Anesthesia Transfer of Care Note  Patient: Timothy Olson  Procedure(s) Performed: Procedure(s): LAPAROSCOPIC ROUX-EN-Y GASTRIC BYPASS WITH UPPER ENDOSCOPY, (N/A)  Patient Location: PACU  Anesthesia Type:General  Level of Consciousness:  sedated, patient cooperative and responds to stimulation  Airway & Oxygen Therapy:Patient Spontanous Breathing and Patient connected to face mask oxgen  Post-op Assessment:  Report given to PACU RN and Post -op Vital signs reviewed and stable  Post vital signs:  Reviewed and stable  Last Vitals:  Vitals:   03/19/17 0521  BP: (!) 149/89  Pulse: 90  Resp: 16  Temp: 01.6 C    Complications: No apparent anesthesia complications

## 2017-03-19 NOTE — Anesthesia Procedure Notes (Signed)
Procedure Name: Intubation Date/Time: 03/19/2017 7:28 AM Performed by: Talbot Grumbling Pre-anesthesia Checklist: Patient identified, Emergency Drugs available, Suction available and Patient being monitored Patient Re-evaluated:Patient Re-evaluated prior to inductionOxygen Delivery Method: Circle system utilized Preoxygenation: Pre-oxygenation with 100% oxygen Intubation Type: IV induction Ventilation: Mask ventilation without difficulty Laryngoscope Size: Glidescope Grade View: Grade II Tube type: Oral Tube size: 8.0 mm Airway Equipment and Method: Stylet and Video-laryngoscopy Placement Confirmation: ETT inserted through vocal cords under direct vision,  positive ETCO2 and breath sounds checked- equal and bilateral Secured at: 23 cm Tube secured with: Tape Dental Injury: Teeth and Oropharynx as per pre-operative assessment

## 2017-03-19 NOTE — Op Note (Signed)
Preoperative diagnosis: Roux-en-Y gastric bypass  Postoperative diagnosis: Same   Procedure: Upper endoscopy   Surgeon: Clovis Riley, M.D.  Anesthesia: Gen.   Indications for procedure: This patient was undergoing a Roux-en-Y gastric bypass.   Description of procedure: The endoscopy was placed in the mouth and into the oropharynx and under endoscopic vision it was advanced to the esophagogastric junction. The pouch was insufflated and no bleeding or bubbles were seen. The GEJ was identified at 44cm from the teeth. The pouch measured 5cm in length. The anastomosis was patent. No bleeding or leaks were detected. The scope was withdrawn without difficulty.   Clovis Riley, M.D. General, Bariatric, & Minimally Invasive Surgery [facility]

## 2017-03-19 NOTE — Op Note (Addendum)
Preop Diagnosis: Obesity Class III  Postop Diagnosis: same  Procedure performed: laparoscopic Roux en Y gastric bypass  Assitant: Romana Juniper  Indications:  The patient is a 41 y.o. year-old morbidly obese male who has been followed in the Bariatric Clinic as an outpatient. This patient was diagnosed with morbid obesity with a BMI of Body mass index is 55.52 kg/m. and significant co-morbidities including hypertension, non-insulin dependent diabetes and hyperlipidemia.  The patient was counseled extensively in the Bariatric Outpatient Clinic and after a thorough explanation of the risks and benefits of surgery (including death from complications, bowel leak, infection such as peritonitis and/or sepsis, internal hernia, bleeding, need for blood transfusion, bowel obstruction, organ failure, pulmonary embolus, deep venous thrombosis, wound infection, incisional hernia, skin breakdown, and others entailed on the consent form) and after a compliant diet and exercise program, the patient was scheduled for an elective laparoscopic sleeve gastrectomy.  Description of Operation:  Following informed consent, the patient was taken to the operating room and placed on the operating table in the supine position.  He had previously received prophylactic antibiotics and subcutaneous heparin for DVT prophylaxis in the pre-op holding area.  After induction of general endotracheal anesthesia by the anesthesiologist, the patient underwent placement of sequential compression devices, Foley catheter and an oro-gastric tube.  A timeout was confirmed by the surgery and anesthesia teams.  The patient was adequately padded at all pressure points and placed on a footboard to prevent slippage from the OR table during extremes of position during surgery.  He underwent a routine sterile prep and drape of her entire abdomen.    Next, A transverse incision was made under the left subcostal area and a 53mm optical viewing trocar  was introduced into the peritoneal cavity. Pneumoperitoneum was applied with a high flow and low pressure. A laparoscope was inserted to confirm placement. A extraperitoneal block was then placed at the lateral abdominal wall using exparel diluted with marcaine . 5 additional trocars were placed: 1 60mm trocar to the left of the midline. 1 additional 34mm trocar in the left lateral area, 1 20mm trocar in the right mid abdomen, and 1 34mm trocar in the right subcostal area.  There were some filmy adhesions between the abdominal wall and omentum. These were taken down with Harmonic scalpel.   The greater omentum was flipped over the transverse colon and under the left lobe of the liver. The ligament of trietz was identified. 30cm of jejunum was measured starting from the ligament of Trietz. The mesentery was checked to ensure mobility. Next, a 52mm 2-60mm tristapler was used to divide the jejunum at this location. The harmonic scalpel was used to divide the mesentery down to the origin. A 1/2" penrose was sutured to the distal side. 100cm of jejunum was measured starting at the division. 2-0 silk was used to appose the biliary limb to the 100cm mark of jejunum in 2 places. Enterotomies were made in the biliary and common channels and a 57mm 2-3 tristapler was used to create the J-J anastomosis. A 2-0 silk was used to appose the enterotomy edges and a 670mm 2-3 tristapler was used to close the enterotomy. An anti-obstruction 2-0 silk suture was placed. Next, the mesenteric defect was closed with a 2-0 silk in running fashion.The J-J appeared patent and in neutral position.  Next, the omentum was divided using the Harmonic scalpel. The patient was placed in steep Reverse Trendelenberg position. A Nathanson retracted was placed through a subxiphoid incision and used  to retract the liver. The pars flaccida was taken down and a small hiatal hernia identified and repaired with a single 2-0 silk suture. The fat pad over  the fundus was incised to free the fundus. Next, a position along the lesser curve 6cm from GE junction was identified. The pars flaccida was entered and the fat over the lesser curve divided to enter the lesser sac. Multiple 96mm 3-19mm tristaple firings were peformed to create a 6cm pouch. 3 clips were used on the remnant stomach for hemostasis of the staple line The Roux limb was identified using the placed penrose and brought up to the stomach in antecolic fashion. The limb was inspected to ensure a neutral position. A 2-0 vicryl suture was then used to create a posterior layer connecting the stomach to the Roux limb jejunum in running fashion. Next cautery was used to create an enterotomy along the medial aspect of this suture line and Harmonic scalpel used to create gastotomy. A 17mm 3-53mm tristapler was then used to create a 25-23mm anastomosis. 2 2-0 vicryl sutures were used in running fashion to close the gastrotomy. Finally, a 2-0 vicryl suture was used to close an anterior layer of stomach and jejunum over the anastomosis in running fashion. There were 2 serosal tears of the roux limb that were closed with single interrupted 2-0 vicryl sutures.The penrose was removed from the Roux limb. A 2-0 vicryl was used to appose the transverse colon mesentery against the roux limb mesentery edge.  The assistant then went and performed an upper endoscopy and leak test. No bubbles were seen and the pouch and limb distended appropriately. The limb and pouch were deflated, the endoscope was removed. Hemostasis was ensured. Pneumoperitoneum was evacuated, all ports were removed and all incisions closed with 4-0 monocryl suture in subcuticular fashion. Glue was put in place for dressing. The patient awoke from anesthesia and was brought to pacu in stable condition. All counts were correct.  Specimens:  None  Local Anesthesia: 50 ml Exparel: 0.5% Marcaine Mix  Post-Op Plan:       Pain Management: PO, prn       Antibiotics: Prophylactic      Anticoagulation: Prophylactic, Starting now      Post Op Studies/Consults: Not applicable      Intended Discharge: within 48h      Intended Outpatient Follow-Up: Two Week      Intended Outpatient Studies: Not Applicable      Other: Not Applicable   Arta Bruce Kinsinger

## 2017-03-19 NOTE — Anesthesia Postprocedure Evaluation (Signed)
Anesthesia Post Note  Patient: Timothy Olson  Procedure(s) Performed: Procedure(s) (LRB): LAPAROSCOPIC ROUX-EN-Y GASTRIC BYPASS WITH UPPER ENDOSCOPY, (N/A)     Patient location during evaluation: PACU Anesthesia Type: General Level of consciousness: awake and alert Pain management: pain level controlled Vital Signs Assessment: post-procedure vital signs reviewed and stable Respiratory status: spontaneous breathing, nonlabored ventilation, respiratory function stable and patient connected to nasal cannula oxygen Cardiovascular status: blood pressure returned to baseline and stable Postop Assessment: no signs of nausea or vomiting Anesthetic complications: no    Last Vitals:  Vitals:   03/19/17 1215 03/19/17 1245  BP: (!) 142/87   Pulse: 91 94  Resp: 17 16  Temp: 37 C 37.1 C    Last Pain:  Vitals:   03/19/17 1245  TempSrc:   PainSc: [place]

## 2017-03-19 NOTE — H&P (Signed)
Timothy Olson is an 41 y.o. male.   Chief Complaint: obesity HPI: 41 yo male with history of obesity, reflux, diabetes, hypertension, hyperlipidemia presents for gastric bypass. He has completed all requirements and is ready to proceed.  Past Medical History:  Diagnosis Date  . Apnea   . Arthritis   . Asthma   . Depression   . 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 GERD (gastroesophageal reflux disease)   . Heart murmur    as a child   . History of kidney stones   . Hyperlipidemia   . Hypertension   . Hypothyroidism   . Sleep apnea    cpap - setting at 16 for pressure     Past Surgical History:  Procedure Laterality Date  . APPENDECTOMY    . CHOLECYSTECTOMY    . left ankle surgery     . right shoulder surgery      x 2  . SMALL INTESTINE SURGERY     age 61    Family History  Problem Relation Age of Onset  . Hyperlipidemia Other   . Hypertension Other    Social History:  reports that he has quit smoking. He has quit using smokeless tobacco. He reports that he does not drink alcohol or use drugs.  Allergies:  Allergies  Allergen Reactions  . Gentamicin Swelling  . Nsaids Swelling and Other (See Comments)    Legs swelling  . Latex Itching, Rash and Other (See Comments)    Skin peels    Medications Prior to Admission  Medication Sig Dispense Refill  . albuterol (VENTOLIN HFA) 108 (90 Base) MCG/ACT inhaler Inhale 1-2 puffs into the lungs every 6 (six) hours as needed for wheezing or shortness of breath.    Marland Kitchen atorvastatin (LIPITOR) 40 MG tablet Take 40 mg by mouth daily.    Marland Kitchen buPROPion (WELLBUTRIN XL) 300 MG 24 hr tablet Take 300 mg by mouth daily.    . fluticasone (FLONASE) 50 MCG/ACT nasal spray Place 2 sprays into both nostrils 2 (two) times daily.    . furosemide (LASIX) 20 MG tablet Take 20 mg by mouth daily with breakfast.    . lamoTRIgine (LAMICTAL) 100 MG tablet Take 100 mg by mouth daily with breakfast.    . levothyroxine (SYNTHROID,  LEVOTHROID) 125 MCG tablet Take 125 mcg by mouth daily before breakfast.    . metFORMIN (GLUCOPHAGE) 500 MG tablet Take 500 mg by mouth 2 (two) times daily.    Marland Kitchen oxyCODONE-acetaminophen (PERCOCET) 10-325 MG tablet Take 1 tablet by mouth every 8 (eight) hours as needed. For pain.  0  . pantoprazole (PROTONIX) 40 MG tablet Take 40 mg by mouth daily before breakfast.    . testosterone cypionate (DEPOTESTOSTERONE CYPIONATE) 200 MG/ML injection Inject 100 mg into the muscle See admin instructions. Every 10 days      Results for orders placed or performed during the hospital encounter of 03/19/17 (from the past 48 hour(s))  Glucose, capillary     Status: Abnormal   Collection Time: 03/19/17  5:15 AM  Result Value Ref Range   Glucose-Capillary 141 (H) 65 - 99 mg/dL   Comment 1 Notify RN    Comment 2 Document in Chart    No results found.  Review of Systems  Constitutional: Negative for chills and fever.  HENT: Negative for hearing loss.   Eyes: Negative for blurred vision and double vision.  Respiratory: Negative for cough and hemoptysis.  Cardiovascular: Negative for chest pain and palpitations.  Gastrointestinal: Negative for abdominal pain, nausea and vomiting.  Genitourinary: Negative for dysuria and urgency.  Musculoskeletal: Negative for myalgias and neck pain.  Skin: Negative for itching and rash.  Neurological: Negative for dizziness, tingling and headaches.  Endo/Heme/Allergies: Does not bruise/bleed easily.  Psychiatric/Behavioral: Negative for depression and suicidal ideas.    Blood pressure (!) 149/89, pulse 90, temperature 98.9 F (37.2 C), temperature source Oral, resp. rate 16, height 5\' 6"  (1.676 m), weight (!) 156 kg (344 lb), SpO2 97 %. Physical Exam  Vitals reviewed. Constitutional: He is oriented to person, place, and time. He appears well-developed and well-nourished.  HENT:  Head: Normocephalic and atraumatic.  Eyes: Conjunctivae and EOM are normal. Pupils are  equal, round, and reactive to light.  Neck: Normal range of motion. Neck supple.  Cardiovascular: Normal rate and regular rhythm.   Respiratory: Effort normal and breath sounds normal.  GI: Soft. Bowel sounds are normal. He exhibits no distension. There is no tenderness.  Musculoskeletal: Normal range of motion.  Neurological: He is alert and oriented to person, place, and time.  Skin: Skin is warm and dry.  Psychiatric: He has a normal mood and affect. His behavior is normal.     Assessment/Plan 41 yo male with morbid obesity -lap gastric bypass -inpatient admission  Mickeal Skinner, MD 03/19/2017, 7:10 AM

## 2017-03-20 LAB — CBC WITH DIFFERENTIAL/PLATELET
BASOS ABS: 0 10*3/uL (ref 0.0–0.1)
BASOS PCT: 0 %
Basophils Absolute: 0 10*3/uL (ref 0.0–0.1)
Basophils Relative: 0 %
EOS PCT: 0 %
EOS PCT: 0 %
Eosinophils Absolute: 0 10*3/uL (ref 0.0–0.7)
Eosinophils Absolute: 0 10*3/uL (ref 0.0–0.7)
HEMATOCRIT: 34.7 % — AB (ref 39.0–52.0)
HEMATOCRIT: 34 % — AB (ref 39.0–52.0)
Hemoglobin: 10.5 g/dL — ABNORMAL LOW (ref 13.0–17.0)
Hemoglobin: 10.5 g/dL — ABNORMAL LOW (ref 13.0–17.0)
LYMPHS PCT: 19 %
Lymphocytes Relative: 12 %
Lymphs Abs: 2 10*3/uL (ref 0.7–4.0)
Lymphs Abs: 3.1 10*3/uL (ref 0.7–4.0)
MCH: 21.8 pg — AB (ref 26.0–34.0)
MCH: 21.9 pg — ABNORMAL LOW (ref 26.0–34.0)
MCHC: 30.3 g/dL (ref 30.0–36.0)
MCHC: 30.9 g/dL (ref 30.0–36.0)
MCV: 72.1 fL — AB (ref 78.0–100.0)
MCV: 70.8 fL — ABNORMAL LOW (ref 78.0–100.0)
MONO ABS: 1.7 10*3/uL — AB (ref 0.1–1.0)
MONOS PCT: 10 %
MONOS PCT: 11 %
Monocytes Absolute: 1.8 10*3/uL — ABNORMAL HIGH (ref 0.1–1.0)
NEUTROS PCT: 78 %
Neutro Abs: 13.3 10*3/uL — ABNORMAL HIGH (ref 1.7–7.7)
Neutro Abs: 11.2 10*3/uL — ABNORMAL HIGH (ref 1.7–7.7)
Neutrophils Relative %: 70 %
PLATELETS: 374 10*3/uL (ref 150–400)
Platelets: 362 10*3/uL (ref 150–400)
RBC: 4.81 MIL/uL (ref 4.22–5.81)
RBC: 4.8 MIL/uL (ref 4.22–5.81)
RDW: 17.6 % — ABNORMAL HIGH (ref 11.5–15.5)
RDW: 17.4 % — ABNORMAL HIGH (ref 11.5–15.5)
WBC: 17 10*3/uL — ABNORMAL HIGH (ref 4.0–10.5)
WBC: 16.1 10*3/uL — AB (ref 4.0–10.5)

## 2017-03-20 LAB — COMPREHENSIVE METABOLIC PANEL
ALK PHOS: 81 U/L (ref 38–126)
ALT: 148 U/L — AB (ref 17–63)
AST: 130 U/L — AB (ref 15–41)
Albumin: 3.8 g/dL (ref 3.5–5.0)
Anion gap: 9 (ref 5–15)
BILIRUBIN TOTAL: 0.6 mg/dL (ref 0.3–1.2)
BUN: 18 mg/dL (ref 6–20)
CALCIUM: 8.7 mg/dL — AB (ref 8.9–10.3)
CHLORIDE: 100 mmol/L — AB (ref 101–111)
CO2: 30 mmol/L (ref 22–32)
CREATININE: 0.95 mg/dL (ref 0.61–1.24)
Glucose, Bld: 145 mg/dL — ABNORMAL HIGH (ref 65–99)
Potassium: 4.5 mmol/L (ref 3.5–5.1)
Sodium: 139 mmol/L (ref 135–145)
Total Protein: 7.1 g/dL (ref 6.5–8.1)

## 2017-03-20 LAB — GLUCOSE, CAPILLARY
GLUCOSE-CAPILLARY: 151 mg/dL — AB (ref 65–99)
Glucose-Capillary: 134 mg/dL — ABNORMAL HIGH (ref 65–99)

## 2017-03-20 MED ORDER — BUPROPION HCL ER (XL) 300 MG PO TB24
300.0000 mg | ORAL_TABLET | Freq: Every day | ORAL | Status: DC
  Administered 2017-03-20: 300 mg via ORAL
  Filled 2017-03-20: qty 1

## 2017-03-20 MED ORDER — LAMOTRIGINE 100 MG PO TABS
100.0000 mg | ORAL_TABLET | Freq: Every day | ORAL | Status: DC
  Administered 2017-03-20: 100 mg via ORAL
  Filled 2017-03-20: qty 1

## 2017-03-20 MED ORDER — LEVOTHYROXINE SODIUM 125 MCG PO TABS
125.0000 ug | ORAL_TABLET | Freq: Every day | ORAL | Status: DC
  Administered 2017-03-20: 125 ug via ORAL
  Filled 2017-03-20: qty 1

## 2017-03-20 NOTE — Progress Notes (Signed)
Discharge instructions discussed with patient, verbalized agreement and understanding 

## 2017-03-20 NOTE — Plan of Care (Signed)
Problem: Food- and Nutrition-Related Knowledge Deficit (NB-1.1) Goal: Nutrition education Formal process to instruct or train a patient/client in a skill or to impart knowledge to help patients/clients voluntarily manage or modify food choices and eating behavior to maintain or improve health. Outcome: Completed/Met Date Met: 03/20/17 Nutrition Education Note  Received consult for diet education per RN.  Discussed 2 week post op diet with pt. Emphasized that liquids must be non carbonated, non caffeinated, and sugar free. Fluid goals discussed. Pt to follow up with outpatient bariatric RD for further diet progression after 2 weeks. Multivitamins and minerals also reviewed. Teach back method used, pt expressed understanding, expect good compliance.   Diet: First 2 Weeks  You will see the nutritionist about two (2) weeks after your surgery. The nutritionist will increase the types of foods you can eat if you are handling liquids well:  If you have severe vomiting or nausea and cannot handle clear liquids lasting longer than 1 day, call your surgeon  Protein Shake  Drink at least 2 ounces of shake 5-6 times per day  Each serving of protein shakes (usually 8 - 12 ounces) should have a minimum of:  15 grams of protein  And no more than 5 grams of carbohydrate  Goal for protein each day:  Men = 80 grams per day  Women = 60 grams per day  Protein powder may be added to fluids such as non-fat milk or Lactaid milk or Soy milk (limit to 35 grams added protein powder per serving)   Hydration  Slowly increase the amount of water and other clear liquids as tolerated (See Acceptable Fluids)  Slowly increase the amount of protein shake as tolerated  Sip fluids slowly and throughout the day  May use sugar substitutes in small amounts (no more than 6 - 8 packets per day; i.e. Splenda)   Fluid Goal  The first goal is to drink at least 8 ounces of protein shake/drink per day (or as directed by the  nutritionist); some examples of protein shakes are Premier Protein, [employer], [employer]R [employer], EAS Edge HP, and Unjury. See handout from pre-op Bariatric Education Class:  Slowly increase the amount of protein shake you drink as tolerated  You may find it easier to slowly sip shakes throughout the day  It is important to get your proteins in first  Your fluid goal is to drink 64 - 100 ounces of fluid daily  It may take a few weeks to build up to this  32 oz (or more) should be clear liquids  And  32 oz (or more) should be full liquids (see below for examples)  Liquids should not contain sugar, caffeine, or carbonation   Clear Liquids:  Water or Sugar-free flavored water (i.e. Fruit H2O, Propel)  Decaffeinated coffee or tea (sugar-free)  Crystal Lite, Wyler's Lite, Minute Maid Lite  Sugar-free Jell-O  Bouillon or broth  Sugar-free Popsicle: *Less than 20 calories each; Limit 1 per day   Full Liquids:  Protein Shakes/Drinks + 2 choices per day of other full liquids  Full liquids must be:  No More Than 12 grams of Carbs per serving  No More Than 3 grams of Fat per serving  Strained low-fat cream soup  Non-Fat milk  Fat-free Lactaid Milk  Sugar-free yogurt (Dannon Lite & Fit, [place] yogurt, Oikos Zero)   Clayton Bibles, MS, RD, LDN Pager: [phone] After Hours Pager: [phone]

## 2017-03-20 NOTE — Progress Notes (Signed)
Patient alert and oriented, pain is controlled. Patient is tolerating fluids, advanced to protein shake today, patient is tolerating well.  Patient has tolerated 11 oz of protein.  Reviewed Gastric Bypass discharge instructions with patient and patient is able to articulate understanding. Provided information on BELT program, Support Group and [facility] outpatient pharmacy. All questions answered, will continue to monitor.  Neil Crouch RN

## 2017-03-20 NOTE — Progress Notes (Signed)
  Progress Note: Metabolic and Bariatric Surgery Service   Chief Complaint/Subjective: Tolerating liquids, no nausea or vomiting, ambulating well  Objective: Vital signs in last 24 hours: Temp:  [97.9 F (36.6 C)-98.9 F (37.2 C)] 97.9 F (36.6 C) (06/26 0519) Pulse Rate:  [84-110] 84 (06/26 0519) Resp:  [14-20] 18 (06/26 0519) BP: (142-185)/(83-109) 154/83 (06/26 0519) SpO2:  [92 %-100 %] 94 % (06/26 0519) Weight:  [166.4 kg (366 lb 13.5 oz)] 166.4 kg (366 lb 13.5 oz) (06/26 0519) Last BM Date: 03/18/17  Intake/Output from previous day: 06/25 0701 - 06/26 0700 In: 4213.3 [P.O.:420; I.V.:3793.3] Out: 2800 [Urine:2750; Blood:50] Intake/Output this shift: No intake/output data recorded.  Lungs: CTAB  Cardiovascular: RRR  Abd: soft, NT, ND, incisions c/d/i  Extremities: no edema  Neuro: AOx4  Lab Results: CBC   Recent Labs  03/19/17 1117 03/20/17 0532  WBC  --  17.0*  HGB 12.0* 10.5*  HCT 39.1 34.7*  PLT  --  374   BMET  Recent Labs  03/20/17 0532  NA 139  K 4.5  CL 100*  CO2 30  GLUCOSE 145*  BUN 18  CREATININE 0.95  CALCIUM 8.7*   PT/INR No results for input(s): LABPROT, INR in the last 72 hours. ABG No results for input(s): PHART, HCO3 in the last 72 hours.  Invalid input(s): PCO2, PO2  Studies/Results:  Anti-infectives: Anti-infectives    Start     Dose/Rate Route Frequency Ordered Stop   03/19/17 0650  cefoTEtan in Dextrose 5% (CEFOTAN) 2-2.08 GM-% IVPB    Comments:  Marla Roe   : cabinet override      03/19/17 0650 03/19/17 0730   03/19/17 0514  cefoTEtan in Dextrose 5% (CEFOTAN) IVPB 2 g     2 g Intravenous On call to O.R. 03/19/17 0514 03/19/17 0745      Medications: Scheduled Meds: . buPROPion  300 mg Oral Daily  . enoxaparin (LOVENOX) injection  30 mg Subcutaneous Q12H  . insulin aspart  0-20 Units Subcutaneous TID WC  . lamoTRIgine  100 mg Oral Daily  . levothyroxine  125 mcg Oral QAC breakfast  . pantoprazole  (PROTONIX) IV  40 mg Intravenous QHS  . [START ON 03/21/2017] protein supplement shake  2 oz Oral Q2H   Continuous Infusions: . sodium chloride 100 mL/hr at 03/20/17 0600   PRN Meds:.oxyCODONE **AND** acetaminophen, acetaminophen, albuterol, metoprolol tartrate, morphine injection, ondansetron (ZOFRAN) IV, simethicone, traMADol  Assessment/Plan: Patient Active Problem List   Diagnosis Date Noted  . Morbid obesity ([place]) 03/19/2017   s/p Procedure(s): LAPAROSCOPIC ROUX-EN-Y GASTRIC BYPASS WITH UPPER ENDOSCOPY, 03/19/2017 -add home meds -continue SSI -recheck CBC in afternoon  Disposition:  LOS: 1 day  The patient will be in the hospital for normal postop protocol  Mickeal Skinner, MD [phone] [facility]

## 2017-03-20 NOTE — Progress Notes (Signed)
   03/20/17 1300  Clinical Encounter Type  Visited With Patient  Visit Type Initial;Psychological support;Spiritual support  Referral From Nurse  Consult/Referral To Chaplain  Spiritual Encounters  Spiritual Needs Other (Comment) (Advance Directive Discussion)  Stress Factors  Patient Stress Factors None identified  Advance Directives (For Healthcare)  Does Patient Have a Medical Advance Directive? No  Would patient like information on creating a medical advance directive? No - Patient declined   I visited with the patient per Spiritual Care consult to discuss an Advance Directive. We talked about who he wanted as his Research scientist (physical sciences). He feels comfortable with his parents making medical decisions in the event that he cannot and states that they know his wishes.   He declined creating an Forensic scientist.   Please, contact Spiritual Care for further assistance.   [place] M.Div.

## 2017-03-22 ENCOUNTER — Encounter (HOSPITAL_COMMUNITY): Payer: Self-pay | Admitting: General Surgery

## 2017-03-29 ENCOUNTER — Telehealth (HOSPITAL_COMMUNITY): Payer: Self-pay

## 2017-03-29 NOTE — Telephone Encounter (Signed)
Made discharge phone call to patient  Asking the following questions.    1. Do you have someone to care for you now that you are home?  independent 2. Are you having pain now that is not relieved by your pain medication?  No pain 3. Are you able to drink the recommended daily amount of fluids (48 ounces minimum/day) and protein (60-80 grams/day) as prescribed by the dietitian or nutritional counselor?  90 grams of protein and 64 ounces of fluid 4. Are you taking the vitamins and minerals as prescribed?  No problems 5. Do you have the "on call" number to contact your surgeon if you have a problem or question?   6. Are your incisions free of redness, swelling or drainage? (If steri strips, address that these can fall off, shower as tolerated) yes 7. Have your bowels moved since your surgery?  If not, are you passing gas?  yes 8. Are you up and walking 3-4 times per day?  yes 9. Were you provided your discharge medications before your surgery or before you were discharged from the hospital and are you taking them without problem?  yes

## 2017-04-03 ENCOUNTER — Encounter: Payer: Medicare Other | Attending: General Surgery | Admitting: Registered"

## 2017-04-03 DIAGNOSIS — Z836 Family history of other diseases of the respiratory system: Secondary | ICD-10-CM | POA: Diagnosis not present

## 2017-04-03 DIAGNOSIS — E079 Disorder of thyroid, unspecified: Secondary | ICD-10-CM | POA: Insufficient documentation

## 2017-04-03 DIAGNOSIS — R011 Cardiac murmur, unspecified: Secondary | ICD-10-CM | POA: Insufficient documentation

## 2017-04-03 DIAGNOSIS — Z8249 Family history of ischemic heart disease and other diseases of the circulatory system: Secondary | ICD-10-CM | POA: Diagnosis not present

## 2017-04-03 DIAGNOSIS — Z6841 Body Mass Index (BMI) 40.0 and over, adult: Secondary | ICD-10-CM | POA: Insufficient documentation

## 2017-04-03 DIAGNOSIS — Z8371 Family history of colonic polyps: Secondary | ICD-10-CM | POA: Insufficient documentation

## 2017-04-03 DIAGNOSIS — K649 Unspecified hemorrhoids: Secondary | ICD-10-CM | POA: Diagnosis not present

## 2017-04-03 DIAGNOSIS — M549 Dorsalgia, unspecified: Secondary | ICD-10-CM | POA: Insufficient documentation

## 2017-04-03 DIAGNOSIS — K219 Gastro-esophageal reflux disease without esophagitis: Secondary | ICD-10-CM | POA: Insufficient documentation

## 2017-04-03 DIAGNOSIS — Z79899 Other long term (current) drug therapy: Secondary | ICD-10-CM | POA: Insufficient documentation

## 2017-04-03 DIAGNOSIS — E669 Obesity, unspecified: Secondary | ICD-10-CM

## 2017-04-03 DIAGNOSIS — J45909 Unspecified asthma, uncomplicated: Secondary | ICD-10-CM | POA: Insufficient documentation

## 2017-04-03 DIAGNOSIS — Z8261 Family history of arthritis: Secondary | ICD-10-CM | POA: Diagnosis not present

## 2017-04-03 DIAGNOSIS — F329 Major depressive disorder, single episode, unspecified: Secondary | ICD-10-CM | POA: Diagnosis not present

## 2017-04-03 DIAGNOSIS — K8020 Calculus of gallbladder without cholecystitis without obstruction: Secondary | ICD-10-CM | POA: Insufficient documentation

## 2017-04-03 DIAGNOSIS — Z818 Family history of other mental and behavioral disorders: Secondary | ICD-10-CM | POA: Diagnosis not present

## 2017-04-03 DIAGNOSIS — Z713 Dietary counseling and surveillance: Secondary | ICD-10-CM | POA: Diagnosis present

## 2017-04-03 DIAGNOSIS — Z9049 Acquired absence of other specified parts of digestive tract: Secondary | ICD-10-CM | POA: Diagnosis not present

## 2017-04-03 DIAGNOSIS — I10 Essential (primary) hypertension: Secondary | ICD-10-CM | POA: Insufficient documentation

## 2017-04-03 DIAGNOSIS — G4730 Sleep apnea, unspecified: Secondary | ICD-10-CM | POA: Diagnosis not present

## 2017-04-03 DIAGNOSIS — E119 Type 2 diabetes mellitus without complications: Secondary | ICD-10-CM | POA: Insufficient documentation

## 2017-04-03 DIAGNOSIS — N200 Calculus of kidney: Secondary | ICD-10-CM | POA: Diagnosis not present

## 2017-04-03 DIAGNOSIS — E7800 Pure hypercholesterolemia, unspecified: Secondary | ICD-10-CM | POA: Diagnosis not present

## 2017-04-03 DIAGNOSIS — Z8349 Family history of other endocrine, nutritional and metabolic diseases: Secondary | ICD-10-CM | POA: Insufficient documentation

## 2017-04-03 DIAGNOSIS — Z87891 Personal history of nicotine dependence: Secondary | ICD-10-CM | POA: Insufficient documentation

## 2017-04-03 DIAGNOSIS — M1990 Unspecified osteoarthritis, unspecified site: Secondary | ICD-10-CM | POA: Insufficient documentation

## 2017-04-03 DIAGNOSIS — Z9889 Other specified postprocedural states: Secondary | ICD-10-CM | POA: Diagnosis not present

## 2017-04-03 DIAGNOSIS — Z811 Family history of alcohol abuse and dependence: Secondary | ICD-10-CM | POA: Insufficient documentation

## 2017-04-03 NOTE — Discharge Summary (Signed)
Physician Discharge Summary  Timothy Olson XNA:[id number] DOB: [birth year] DOA: 03/19/2017  PCP: Raelyn Number, MD  Admit date: 03/19/2017 Discharge date: 03/20/17  Recommendations for Outpatient Follow-up:  1. (include homehealth, outpatient follow-up instructions, specific recommendations for PCP to follow-up on, etc.)  Follow-up Information    Kinsinger, Arta Bruce, MD Follow up on 04/19/2017.   Specialty:  General Surgery Why:  at 1145am  Post op follow up appointment  Contact information: [address] [place] [address] [phone]        Kinsinger, Arta Bruce, MD Follow up.   Specialty:  General Surgery Contact information: [place] [address] [phone]          Discharge Diagnoses:  Active Problems:   Morbid obesity ([place])   Surgical Procedure: Laparoscopic Sleeve Gastrectomy, upper endoscopy  Discharge Condition: Good Disposition: Home  Diet recommendation: Postoperative sleeve gastrectomy diet (liquids only)  Filed Weights   03/19/17 0537 03/20/17 0519  Weight: (!) 156 kg (344 lb) (!) 166.4 kg (366 lb 13.5 oz)     Hospital Course:  The patient was admitted after undergoing Roux-en-Y gastric bypass. POD 0 he ambulated well. POD 1 he was started on the water diet protocol and tolerated 400 ml in the first shift. Once meeting the water amount he was advanced to bariatric protein shakes which they tolerated and were discharged home POD 1.  Treatments: surgery: Roux-en-Y gastric bypass  Discharge Instructions  Discharge Instructions    Ambulate hourly while awake    Complete by:  As directed    Call MD for:  difficulty breathing, headache or visual disturbances    Complete by:  As directed    Call MD for:  persistant dizziness or light-headedness    Complete by:  As directed    Call MD for:  persistant nausea and vomiting    Complete by:  As directed    Call MD for:  redness, tenderness, or signs of  infection (pain, swelling, redness, odor or green/yellow discharge around incision site)    Complete by:  As directed    Call MD for:  severe uncontrolled pain    Complete by:  As directed    Call MD for:  temperature >101 F    Complete by:  As directed    Diet bariatric full liquid    Complete by:  As directed    Discharge wound care:    Complete by:  As directed    Remove Bandaids tomorrow, ok to shower tomorrow. Steristrips may fall off in 1-3 weeks.   Incentive spirometry    Complete by:  As directed    Perform hourly while awake     Allergies as of 03/20/2017      Reactions   Gentamicin Swelling   Nsaids Swelling, Other (See Comments)   Legs swelling   Latex Itching, Rash, Other (See Comments)   Skin peels      Medication List    STOP taking these medications   furosemide 20 MG tablet Commonly known as:  LASIX     TAKE these medications   atorvastatin 40 MG tablet Commonly known as:  LIPITOR Take 40 mg by mouth daily.   buPROPion 300 MG 24 hr tablet Commonly known as:  WELLBUTRIN XL Take 300 mg by mouth daily.   fluticasone 50 MCG/ACT nasal spray Commonly known as:  FLONASE Place 2 sprays into both nostrils 2 (two) times daily.   lamoTRIgine 100  MG tablet Commonly known as:  LAMICTAL Take 100 mg by mouth daily with breakfast.   levothyroxine 125 MCG tablet Commonly known as:  SYNTHROID, LEVOTHROID Take 125 mcg by mouth daily before breakfast.   metFORMIN 500 MG tablet Commonly known as:  GLUCOPHAGE Take 500 mg by mouth 2 (two) times daily. Notes to patient:  Monitor Blood Sugar Frequently and keep a log for primary care physician, you may need to adjust medication dosage with rapid weight loss.     oxyCODONE-acetaminophen 10-325 MG tablet Commonly known as:  PERCOCET Take 1 tablet by mouth every 8 (eight) hours as needed. For pain.   pantoprazole 40 MG tablet Commonly known as:  PROTONIX Take 40 mg by mouth daily before breakfast.   testosterone  cypionate 200 MG/ML injection Commonly known as:  DEPOTESTOSTERONE CYPIONATE Inject 100 mg into the muscle See admin instructions. Every 10 days   VENTOLIN HFA 108 (90 Base) MCG/ACT inhaler Generic drug:  albuterol Inhale 1-2 puffs into the lungs every 6 (six) hours as needed for wheezing or shortness of breath.      Follow-up Information    Kinsinger, Arta Bruce, MD Follow up on 04/19/2017.   Specialty:  General Surgery Why:  at 1145am  Post op follow up appointment  Contact information: [address] [place] [address] [phone]        Kinsinger, Arta Bruce, MD Follow up.   Specialty:  General Surgery Contact information: [place] [address] [phone]            The results of significant diagnostics from this hospitalization (including imaging, microbiology, ancillary and laboratory) are listed below for reference.    Significant Diagnostic Studies: No results found.  Labs: Basic Metabolic Panel: No results for input(s): NA, K, CL, CO2, GLUCOSE, BUN, CREATININE, CALCIUM, MG, PHOS in the last 168 hours. Liver Function Tests: No results for input(s): AST, ALT, ALKPHOS, BILITOT, PROT, ALBUMIN in the last 168 hours.  CBC: No results for input(s): WBC, NEUTROABS, HGB, HCT, MCV, PLT in the last 168 hours.  CBG: No results for input(s): GLUCAP in the last 168 hours.  Active Problems:   Morbid obesity ([place])   Time coordinating discharge: <65min

## 2017-04-04 NOTE — Progress Notes (Signed)
Bariatric Class:  Appt start time: 1530 end time:  1630.  2 Week Post-Operative Nutrition Class  Patient was seen on 04/03/2017 for Post-[facility]ative Nutrition [facility].   Surgery date: 03/19/2017 Surgery type: RYGB Start weight at [facility]: 352.1 lbs Weight today: 329.4 lbs Weight change: 22.7 lbs loss  TANITA  BODY COMP RESULTS  04/03/2017   BMI (kg/m^2) 53.2   Fat Mass (lbs) 158.4   Fat Free Mass (lbs) 171.0   Total Body Water (lbs) N/A    Pt reports he no longer takes Lasiks. Pt reports experiencing "dizziness/lightheadedness when BS is low", nausea, diarrhea, and gas daily. Pt states checking BS 3x/day: 70-140. Pt reports he is unsure of last A1c value. Pt states he used glucose tablets in response to hypoglycemia. RD educated pt on behaviors to improve: - hypoglycemia prevention - nausea - diarrhea - gas  The following the learning objectives were met by the patient during this course:  Identifies Phase 3A (Soft, High Proteins) Dietary Goals and will begin from 2 weeks post-operatively to 2 months post-operatively  Identifies appropriate sources of fluids and proteins   States protein recommendations and appropriate sources post-operatively  Identifies the need for appropriate texture modifications, mastication, and bite sizes when consuming solids  Identifies appropriate multivitamin and calcium sources post-operatively  Describes the need for physical activity post-operatively and will follow MD recommendations  States when to call healthcare provider regarding medication questions or post-operative complications  Handouts given during class include:  Phase 3A: Soft, High Protein Diet Handout  Follow-Up Plan: Patient will follow-up at [facility] in 6 weeks for 2 month post-op nutrition visit for diet advancement per MD.

## 2017-05-14 ENCOUNTER — Encounter: Payer: Medicare Other | Attending: General Surgery | Admitting: Skilled Nursing Facility1

## 2017-05-14 ENCOUNTER — Encounter: Payer: Self-pay | Admitting: Skilled Nursing Facility1

## 2017-05-14 DIAGNOSIS — K219 Gastro-esophageal reflux disease without esophagitis: Secondary | ICD-10-CM | POA: Insufficient documentation

## 2017-05-14 DIAGNOSIS — E7800 Pure hypercholesterolemia, unspecified: Secondary | ICD-10-CM | POA: Diagnosis not present

## 2017-05-14 DIAGNOSIS — R011 Cardiac murmur, unspecified: Secondary | ICD-10-CM | POA: Diagnosis not present

## 2017-05-14 DIAGNOSIS — K8020 Calculus of gallbladder without cholecystitis without obstruction: Secondary | ICD-10-CM | POA: Diagnosis not present

## 2017-05-14 DIAGNOSIS — Z713 Dietary counseling and surveillance: Secondary | ICD-10-CM | POA: Insufficient documentation

## 2017-05-14 DIAGNOSIS — Z818 Family history of other mental and behavioral disorders: Secondary | ICD-10-CM | POA: Insufficient documentation

## 2017-05-14 DIAGNOSIS — K649 Unspecified hemorrhoids: Secondary | ICD-10-CM | POA: Diagnosis not present

## 2017-05-14 DIAGNOSIS — Z8249 Family history of ischemic heart disease and other diseases of the circulatory system: Secondary | ICD-10-CM | POA: Insufficient documentation

## 2017-05-14 DIAGNOSIS — Z6841 Body Mass Index (BMI) 40.0 and over, adult: Secondary | ICD-10-CM | POA: Insufficient documentation

## 2017-05-14 DIAGNOSIS — Z9889 Other specified postprocedural states: Secondary | ICD-10-CM | POA: Diagnosis not present

## 2017-05-14 DIAGNOSIS — Z8261 Family history of arthritis: Secondary | ICD-10-CM | POA: Insufficient documentation

## 2017-05-14 DIAGNOSIS — N200 Calculus of kidney: Secondary | ICD-10-CM | POA: Diagnosis not present

## 2017-05-14 DIAGNOSIS — M549 Dorsalgia, unspecified: Secondary | ICD-10-CM | POA: Insufficient documentation

## 2017-05-14 DIAGNOSIS — M1990 Unspecified osteoarthritis, unspecified site: Secondary | ICD-10-CM | POA: Diagnosis not present

## 2017-05-14 DIAGNOSIS — I10 Essential (primary) hypertension: Secondary | ICD-10-CM | POA: Insufficient documentation

## 2017-05-14 DIAGNOSIS — J45909 Unspecified asthma, uncomplicated: Secondary | ICD-10-CM | POA: Insufficient documentation

## 2017-05-14 DIAGNOSIS — F329 Major depressive disorder, single episode, unspecified: Secondary | ICD-10-CM | POA: Diagnosis not present

## 2017-05-14 DIAGNOSIS — E079 Disorder of thyroid, unspecified: Secondary | ICD-10-CM | POA: Insufficient documentation

## 2017-05-14 DIAGNOSIS — E119 Type 2 diabetes mellitus without complications: Secondary | ICD-10-CM | POA: Diagnosis not present

## 2017-05-14 DIAGNOSIS — Z87891 Personal history of nicotine dependence: Secondary | ICD-10-CM | POA: Insufficient documentation

## 2017-05-14 DIAGNOSIS — Z836 Family history of other diseases of the respiratory system: Secondary | ICD-10-CM | POA: Insufficient documentation

## 2017-05-14 DIAGNOSIS — Z8371 Family history of colonic polyps: Secondary | ICD-10-CM | POA: Insufficient documentation

## 2017-05-14 DIAGNOSIS — Z79899 Other long term (current) drug therapy: Secondary | ICD-10-CM | POA: Insufficient documentation

## 2017-05-14 DIAGNOSIS — G4730 Sleep apnea, unspecified: Secondary | ICD-10-CM | POA: Insufficient documentation

## 2017-05-14 DIAGNOSIS — Z8349 Family history of other endocrine, nutritional and metabolic diseases: Secondary | ICD-10-CM | POA: Insufficient documentation

## 2017-05-14 DIAGNOSIS — Z9049 Acquired absence of other specified parts of digestive tract: Secondary | ICD-10-CM | POA: Diagnosis not present

## 2017-05-14 DIAGNOSIS — Z811 Family history of alcohol abuse and dependence: Secondary | ICD-10-CM | POA: Insufficient documentation

## 2017-05-14 NOTE — Patient Instructions (Addendum)
-  For protein bar: 15 grams of protein or more and double digits of fiber   -Be sure to chew until applesauce consistency  - Eat all of your protein first then start in on your vegetables  -Start out with soft cooked vegetable first   -Try the multivitamin capsule

## 2017-05-14 NOTE — Progress Notes (Signed)
Follow-up visit: 8 Weeks Post-Operative RYGB Surgery  Primary concerns today: Post-operative Bariatric Surgery Nutrition Management.  Pt states in the morning 30 minutes after he takes his thyroid medicine pt states he gets nauseous weak shaky. Pt states he will see his doctor that prescribed the metformin next month. Pt states he tried pork chop and one day eggs and one day shrimp caused nausea. Pt states he has not had any drops since having a a protein bar at about 8pm stating he doesn't particularly like the protein bar but it works. Pts A1C as of 2 months ago 7.6.   Surgery date: 03/19/2017 Surgery type: RYGB Start weight at [facility]: 352.1 lbs Weight today: 329.4 lbs Weight change: 27 lbs loss  TANITA  BODY COMP RESULTS  04/03/2017 05/14/2017   BMI (kg/m^2) 53.2 48.8   Fat Mass (lbs) 158.4 161.2   Fat Free Mass (lbs) 171.0 141.2   Total Body Water (lbs) N/A N/A   24-hr recall: B (7 AM): protein shake yay whey (water, PB2 and protein powder) 30g Snk (AM): beef jerky and cheese  10g L (PM): protein shake 30g Snk (PM):protein shake 30g D (PM): protein shake 30g Snk (PM): protein bar (pure protein) 21 g   Fluid intake: water (100 ounces) Estimated total protein intake: 151-180 g  Medications: No more blood pressure medication  Supplementation: multi gummy from [employer] and calcium   CBG monitoring: 3 times a day Average CBG per patient: 100 with some drops down to the 70's Last patient reported A1c: unknown   Using straws: no Drinking while eating: no Having you been chewing well:no Chewing/swallowing difficulties: no Changes in vision: no Changes to mood/headaches: no Hair loss/Cahnges to skin/Changes to nails: no Any difficulty focusing or concentrating: no Sweating: no Dizziness/Lightheaded: some with blood sugar drops Palpitations: no  Carbonated beverages: no N/V/D/C/GAS: from glucose lows  Abdominal Pain: no  Recent physical activity:  7 days a week 60 minutes    Progress Towards Goal(s):  In progress.  Handouts given during visit include:  Non starchy veggies + protein   Nutritional Diagnosis:  [place]-3.3 Overweight/obesity related to past poor dietary habits and physical inactivity as evidenced by patient w/ recent RYGB surgery following dietary guidelines for continued weight loss.   Intervention:  Nutrition counseling. Dietitian educated the pt on advancing his diet to include non-starchy vegetables.  Goals: -For protein bar: 15 grams of protein or more and double digits of fiber  -Be sure to chew until applesauce consistency - Eat all of your protein first then start in on your vegetables -Start out with soft cooked vegetable first  -Try the multivitamin capsule   Teaching Method Utilized:  Visual Auditory Hands on  Barriers to learning/adherence to lifestyle change: none identified   Demonstrated degree of understanding via:  Teach Back   Monitoring/Evaluation:  Dietary intake, exercise, and body weight.

## 2017-12-16 DIAGNOSIS — E669 Obesity, unspecified: Secondary | ICD-10-CM | POA: Diagnosis not present

## 2017-12-16 DIAGNOSIS — R0789 Other chest pain: Secondary | ICD-10-CM | POA: Diagnosis not present

## 2017-12-16 DIAGNOSIS — R55 Syncope and collapse: Secondary | ICD-10-CM

## 2017-12-16 DIAGNOSIS — E119 Type 2 diabetes mellitus without complications: Secondary | ICD-10-CM | POA: Diagnosis not present

## 2017-12-17 DIAGNOSIS — R0789 Other chest pain: Secondary | ICD-10-CM | POA: Diagnosis not present

## 2017-12-17 DIAGNOSIS — E669 Obesity, unspecified: Secondary | ICD-10-CM | POA: Diagnosis not present

## 2017-12-17 DIAGNOSIS — E119 Type 2 diabetes mellitus without complications: Secondary | ICD-10-CM | POA: Diagnosis not present

## 2017-12-17 DIAGNOSIS — R079 Chest pain, unspecified: Secondary | ICD-10-CM

## 2017-12-17 DIAGNOSIS — R55 Syncope and collapse: Secondary | ICD-10-CM | POA: Diagnosis not present

## 2018-04-12 IMAGING — RF DG UGI W/ KUB
15 of 16 series · 15 of 16 positions shown · non-contrast
Comparison: CT abdomen pelvis 12/14/2015.

CLINICAL DATA: Preop bariatric surgery.

EXAM:
UPPER GI SERIES WITH KUB
TECHNIQUE: After obtaining a scout radiograph a routine upper GI series was
performed using thin barium.
FLUOROSCOPY TIME:  Fluoroscopy Time:  1 minutes 6 seconds
Radiation Exposure Index (if provided by the fluoroscopic device):
81 mGy
Number of Acquired Spot Images: 10

[Series 1: t abdomen supine · 0.15mm/px · 1 of 1 slices shown (1 of 2)]
[im 1/1]
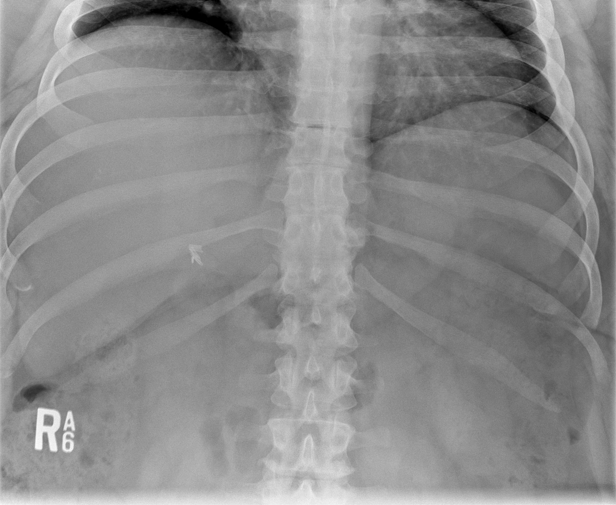

[Series 2: t abdomen supine · 0.15mm/px · 1 of 1 slices shown (2 of 2)]
[im 1/1]
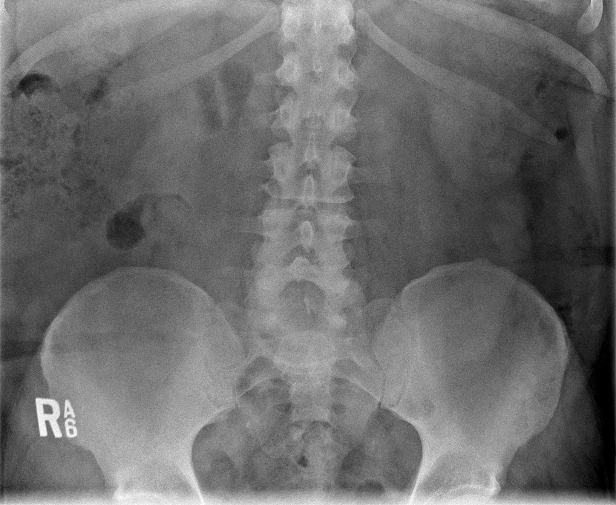

[Series 3: cp_standard · 0.25mm/px · 1 of 1 slices shown (1 of 6)]
[im 1/1]
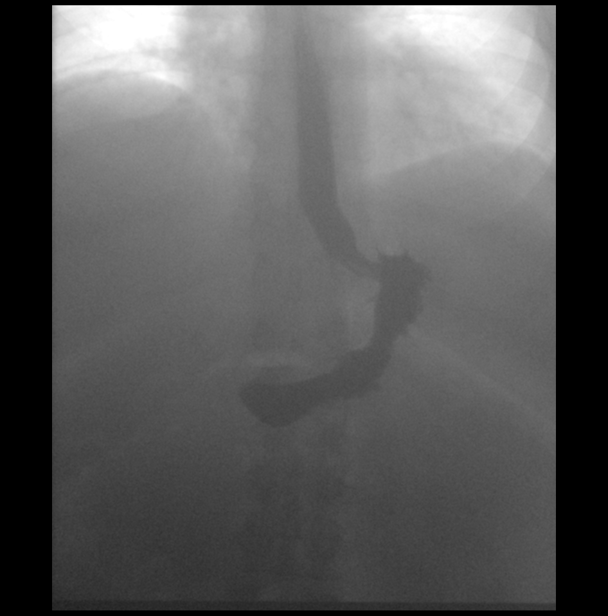

[Series 4: cp_standard · 0.25mm/px · 1 of 1 slices shown (2 of 6)]
[im 1/1]
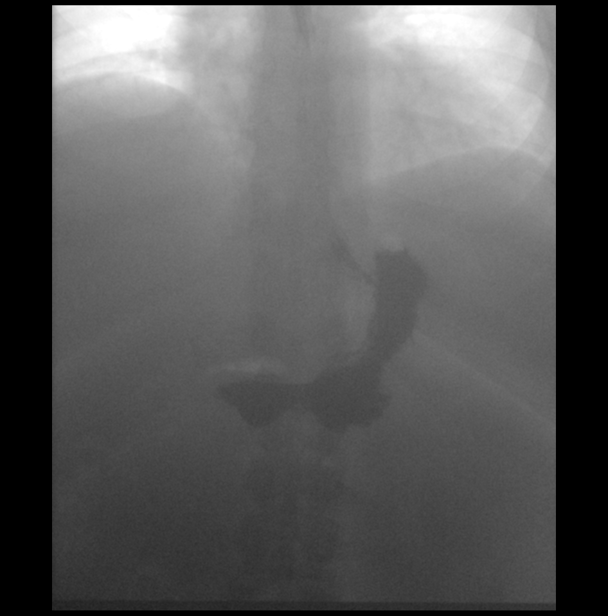

[Series 5: cp_standard · 0.25mm/px · 1 of 1 slices shown (3 of 6)]
[im 1/1]
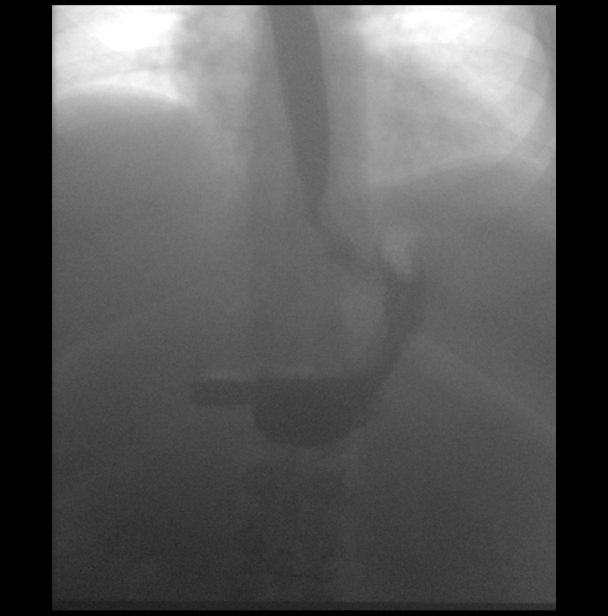

[Series 6: cp_standard · 0.25mm/px · 1 of 1 slices shown (4 of 6)]
[im 1/1]
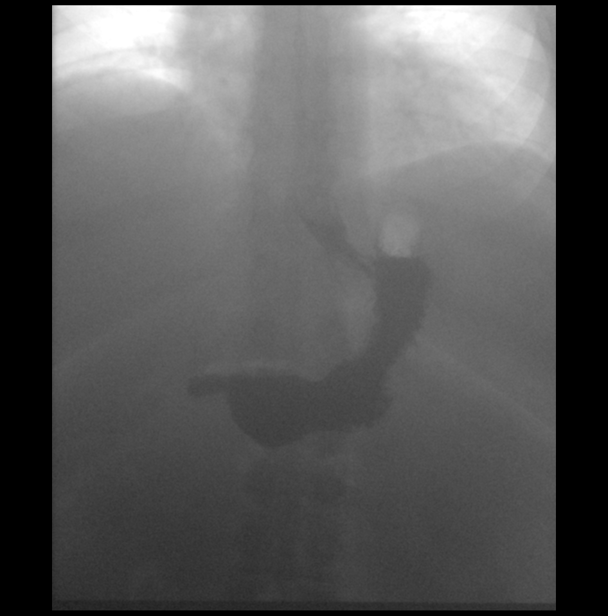

[Series 7: fluoro_barium singleshot_bw · 0.19mm/px · 1 of 1 slices shown (1 of 7)]
[im 1/1]
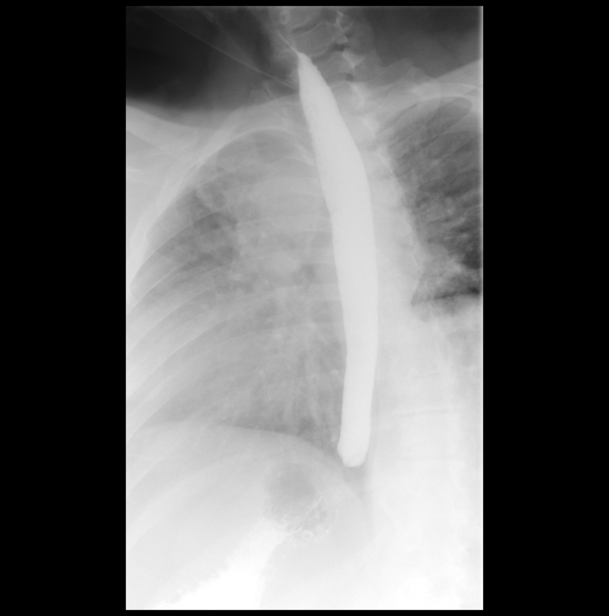

[Series 9: cp_standard · 0.29mm/px · 1 of 1 slices shown (5 of 6)]
[im 1/1]
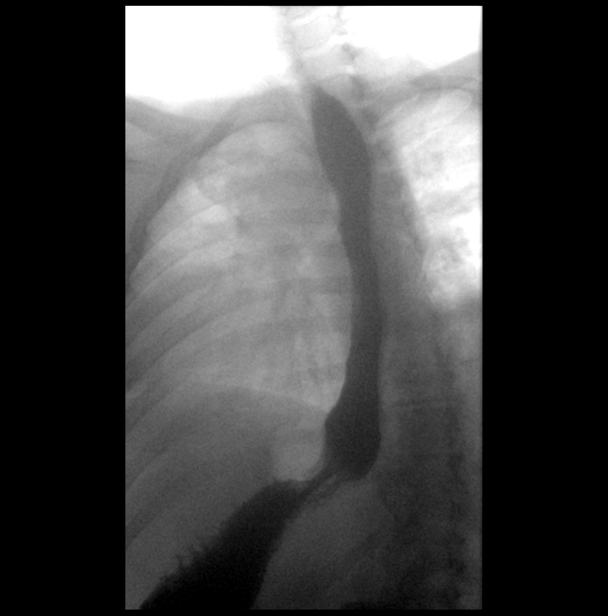

[Series 10: fluoro_barium singleshot_bw · 0.19mm/px · 1 of 1 slices shown (2 of 7)]
[im 1/1]
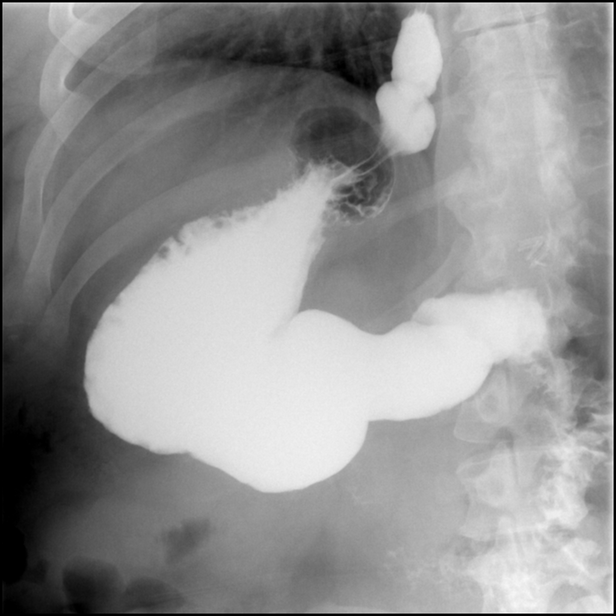

[Series 11: cp_standard · 0.19mm/px · 1 of 1 slices shown (6 of 6)]
[im 1/1]
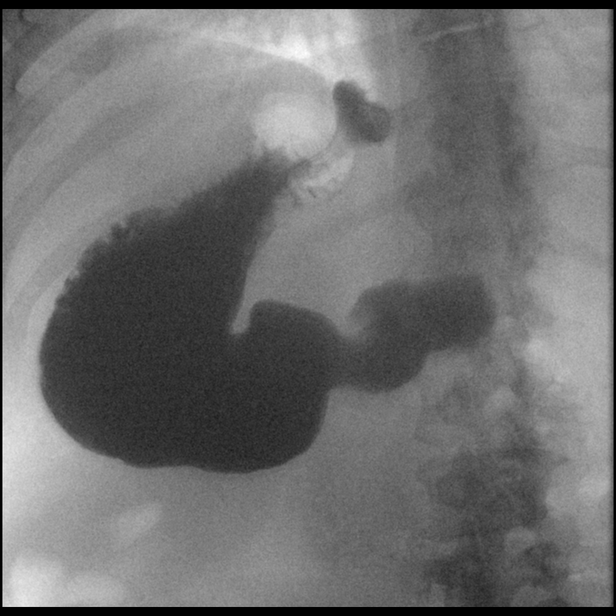

[Series 12: fluoro_barium singleshot_bw · 0.19mm/px · 1 of 1 slices shown (3 of 7)]
[im 1/1]
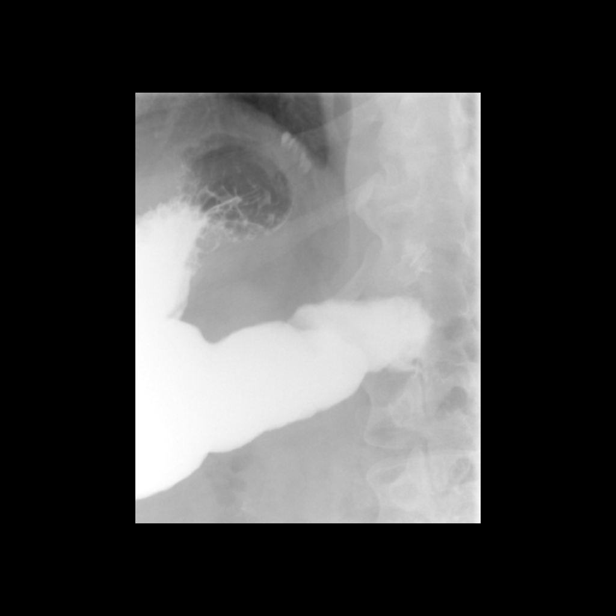

[Series 13: fluoro_barium singleshot_bw · 0.19mm/px · 1 of 1 slices shown (4 of 7)]
[im 1/1]
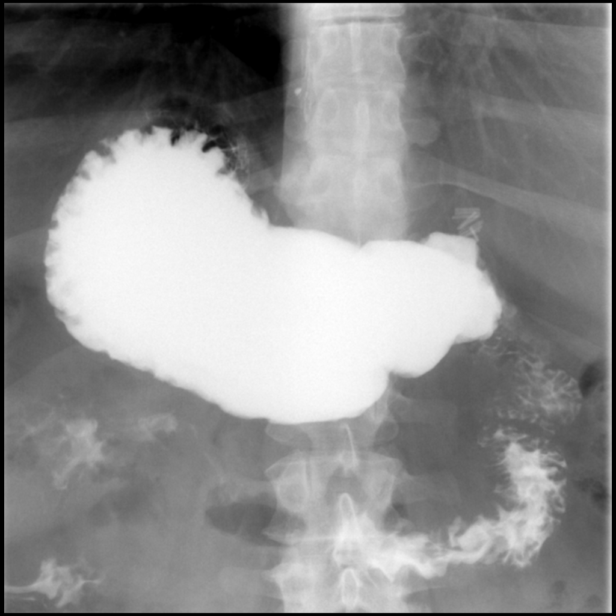

[Series 14: fluoro_barium singleshot_bw · 0.19mm/px · 1 of 1 slices shown (5 of 7)]
[im 1/1]
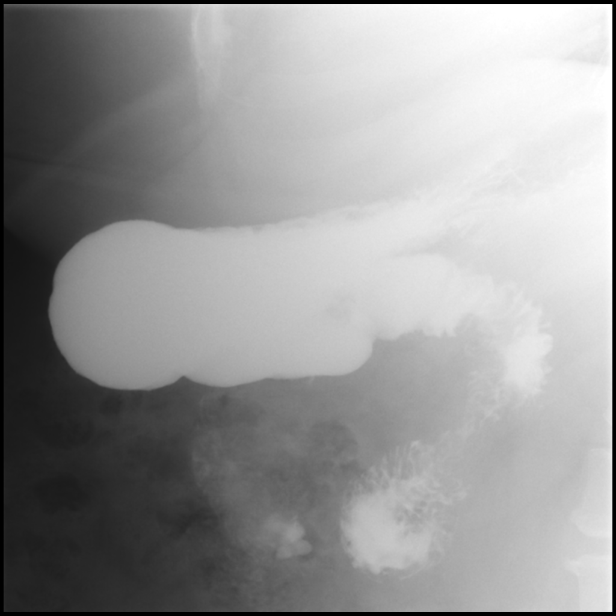

[Series 15: fluoro_barium singleshot_bw · 0.18mm/px · 1 of 1 slices shown (6 of 7)]
[im 1/1]
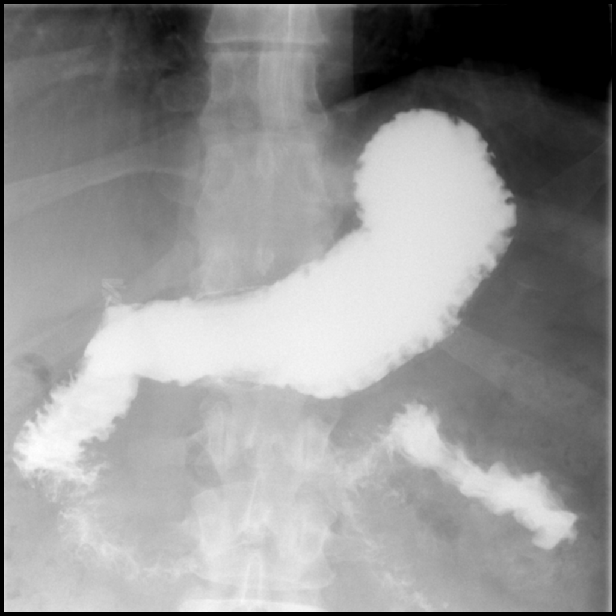

[Series 16: fluoro_barium singleshot_bw · 0.17mm/px · 1 of 1 slices shown (7 of 7)]
[im 1/1]
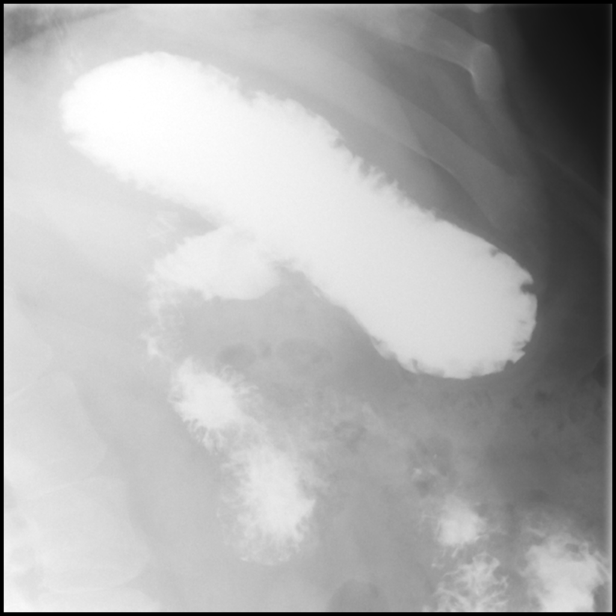

[15 of 16 positions shown; findings below may reference images not displayed]

FINDINGS: Scout view of the abdomen shows a normal bowel gas pattern. Single
contrast examination of the upper gastrointestinal tract shows
normal esophageal motility. No esophageal fold thickening, stricture
or obstruction. Tiny hiatal hernia. Stomach and duodenal bulb are
normal in appearance.
IMPRESSION: Tiny hiatal hernia.

## 2019-07-09 ENCOUNTER — Other Ambulatory Visit: Payer: Self-pay

## 2019-07-09 ENCOUNTER — Inpatient Hospital Stay (HOSPITAL_COMMUNITY)
Admission: AD | Admit: 2019-07-09 | Discharge: 2019-07-11 | DRG: 390 | Disposition: A | Discharge status: Home / Self Care | Payer: Medicare Other | Source: Other Acute Inpatient Hospital | Attending: Surgery | Admitting: Surgery

## 2019-07-09 DIAGNOSIS — I10 Essential (primary) hypertension: Secondary | ICD-10-CM | POA: Diagnosis present

## 2019-07-09 DIAGNOSIS — E785 Hyperlipidemia, unspecified: Secondary | ICD-10-CM | POA: Diagnosis present

## 2019-07-09 DIAGNOSIS — K219 Gastro-esophageal reflux disease without esophagitis: Secondary | ICD-10-CM | POA: Diagnosis present

## 2019-07-09 DIAGNOSIS — Z79899 Other long term (current) drug therapy: Secondary | ICD-10-CM

## 2019-07-09 DIAGNOSIS — Z23 Encounter for immunization: Secondary | ICD-10-CM | POA: Diagnosis present

## 2019-07-09 DIAGNOSIS — Z888 Allergy status to other drugs, medicaments and biological substances status: Secondary | ICD-10-CM

## 2019-07-09 DIAGNOSIS — Z79890 Hormone replacement therapy (postmenopausal): Secondary | ICD-10-CM

## 2019-07-09 DIAGNOSIS — Z87891 Personal history of nicotine dependence: Secondary | ICD-10-CM

## 2019-07-09 DIAGNOSIS — R109 Unspecified abdominal pain: Secondary | ICD-10-CM | POA: Diagnosis present

## 2019-07-09 DIAGNOSIS — Z87442 Personal history of urinary calculi: Secondary | ICD-10-CM | POA: Diagnosis not present

## 2019-07-09 DIAGNOSIS — M1990 Unspecified osteoarthritis, unspecified site: Secondary | ICD-10-CM | POA: Diagnosis present

## 2019-07-09 DIAGNOSIS — E039 Hypothyroidism, unspecified: Secondary | ICD-10-CM | POA: Diagnosis present

## 2019-07-09 DIAGNOSIS — Z8249 Family history of ischemic heart disease and other diseases of the circulatory system: Secondary | ICD-10-CM | POA: Diagnosis not present

## 2019-07-09 DIAGNOSIS — Z91040 Latex allergy status: Secondary | ICD-10-CM

## 2019-07-09 DIAGNOSIS — Z7984 Long term (current) use of oral hypoglycemic drugs: Secondary | ICD-10-CM

## 2019-07-09 DIAGNOSIS — F329 Major depressive disorder, single episode, unspecified: Secondary | ICD-10-CM | POA: Diagnosis present

## 2019-07-09 DIAGNOSIS — Z79891 Long term (current) use of opiate analgesic: Secondary | ICD-10-CM | POA: Diagnosis not present

## 2019-07-09 DIAGNOSIS — E119 Type 2 diabetes mellitus without complications: Secondary | ICD-10-CM | POA: Diagnosis present

## 2019-07-09 DIAGNOSIS — G4730 Sleep apnea, unspecified: Secondary | ICD-10-CM | POA: Diagnosis present

## 2019-07-09 DIAGNOSIS — J45909 Unspecified asthma, uncomplicated: Secondary | ICD-10-CM | POA: Diagnosis present

## 2019-07-09 DIAGNOSIS — K56609 Unspecified intestinal obstruction, unspecified as to partial versus complete obstruction: Secondary | ICD-10-CM | POA: Diagnosis present

## 2019-07-09 DIAGNOSIS — Z9884 Bariatric surgery status: Secondary | ICD-10-CM

## 2019-07-09 LAB — GLUCOSE, CAPILLARY
Glucose-Capillary: 99 mg/dL (ref 70–99)
Glucose-Capillary: 104 mg/dL — ABNORMAL HIGH (ref 70–99)

## 2019-07-09 LAB — HEMOGLOBIN A1C
Hgb A1c MFr Bld: 6.2 % — ABNORMAL HIGH (ref 4.8–5.6)
Mean Plasma Glucose: 131.24 mg/dL

## 2019-07-09 MED ORDER — ONDANSETRON HCL 4 MG/2ML IJ SOLN: 4.0000 mg | Freq: Four times a day (QID) | INTRAMUSCULAR | Status: DC | PRN

## 2019-07-09 MED ORDER — ALBUTEROL SULFATE (2.5 MG/3ML) 0.083% IN NEBU: 2.5000 mg | INHALATION_SOLUTION | Freq: Four times a day (QID) | RESPIRATORY_TRACT | Status: DC | PRN

## 2019-07-09 MED ORDER — METOCLOPRAMIDE HCL 5 MG/ML IJ SOLN
10.0000 mg | Freq: Three times a day (TID) | INTRAMUSCULAR | Status: AC
  Administered 2019-07-09: 10 mg via INTRAVENOUS
  Filled 2019-07-09 (×2): qty 2

## 2019-07-09 MED ORDER — MORPHINE SULFATE (PF) 2 MG/ML IV SOLN
1.0000 mg | INTRAVENOUS | Status: DC | PRN
  Administered 2019-07-09 – 2019-07-10 (×3): 2 mg via INTRAVENOUS
  Filled 2019-07-09 (×3): qty 1

## 2019-07-09 MED ORDER — DIPHENHYDRAMINE HCL 50 MG/ML IJ SOLN: 25.0000 mg | Freq: Four times a day (QID) | INTRAMUSCULAR | Status: DC | PRN

## 2019-07-09 MED ORDER — ENOXAPARIN SODIUM 40 MG/0.4ML ~~LOC~~ SOLN
40.0000 mg | SUBCUTANEOUS | Status: DC
  Administered 2019-07-09 – 2019-07-10 (×2): 40 mg via SUBCUTANEOUS
  Filled 2019-07-09 (×2): qty 0.4

## 2019-07-09 MED ORDER — ONDANSETRON 4 MG PO TBDP: 4.0000 mg | ORAL_TABLET | Freq: Four times a day (QID) | ORAL | Status: DC | PRN

## 2019-07-09 MED ORDER — POTASSIUM CHLORIDE IN NACL 20-0.9 MEQ/L-% IV SOLN
INTRAVENOUS | Status: DC
  Administered 2019-07-09 – 2019-07-11 (×6): via INTRAVENOUS
  Filled 2019-07-09 (×5): qty 1000

## 2019-07-09 MED ORDER — DIPHENHYDRAMINE HCL 25 MG PO CAPS: 25.0000 mg | ORAL_CAPSULE | Freq: Four times a day (QID) | ORAL | Status: DC | PRN

## 2019-07-09 MED ORDER — ALBUTEROL SULFATE HFA 108 (90 BASE) MCG/ACT IN AERS
1.0000 | INHALATION_SPRAY | Freq: Four times a day (QID) | RESPIRATORY_TRACT | Status: DC | PRN
Start: 2019-07-09 — End: 2019-07-09

## 2019-07-09 MED ORDER — INSULIN ASPART 100 UNIT/ML ~~LOC~~ SOLN: 0.0000 [IU] | SUBCUTANEOUS | Status: DC

## 2019-07-09 NOTE — H&P (Signed)
Timothy Olson is an 43 y.o. male.   Chief Complaint: abdominal pain HPI: This is a 43 year old gentleman transferred from [facility].  He presented there today with a 1 day history of sharp abdominal pain.  He had nausea but no vomiting.  His last bowel movement was this morning.  The pain started yesterday and is worse when he tries to bend.  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 Kieth Brightly.  He reports he has done mostly well until now.  He underwent a CAT scan of the abdomen and pelvis which showed dilated loops of bowel and a transition point which was difficult to exactly tell where given his anatomy.  They felt like it was the more distal small bowel.  He reports he was originally bloated but since arriving here he is feeling much better but still having some abdominal pain.  He reports his abdominal distention has regressed significantly and he has passed gas.  They made multiple attempts to place a nasogastric tube prior to transfer without success.  Past Medical History:  Diagnosis Date  . Apnea   . Arthritis   . Asthma   . Depression   . 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 GERD (gastroesophageal reflux disease)   . Heart murmur    as a child   . History of kidney stones   . Hyperlipidemia   . Hypertension   . Hypothyroidism   . Sleep apnea    cpap - setting at 17 for pressure     Past Surgical History:  Procedure Laterality Date  . APPENDECTOMY    . CHOLECYSTECTOMY    . GASTRIC ROUX-EN-Y N/A 03/19/2017   Procedure: LAPAROSCOPIC ROUX-EN-Y GASTRIC BYPASS WITH UPPER ENDOSCOPY,;  Surgeon: Kieth Brightly Arta Bruce, MD;  Location: [facility];  Service: General;  Laterality: N/A;  . left ankle surgery     . right shoulder surgery      x 2  . SMALL INTESTINE SURGERY     age 7    Family History  Problem Relation Age of Onset  . Hyperlipidemia Other   . Hypertension Other    Social History:  reports  that he has quit smoking. He has quit using smokeless tobacco. He reports that he does not drink alcohol or use drugs.  Allergies:  Allergies  Allergen Reactions  . Gentamicin Swelling  . Nsaids Swelling and Other (See Comments)    Legs swelling  . Latex Itching, Rash and Other (See Comments)    Skin peels    Medications Prior to Admission  Medication Sig Dispense Refill  . albuterol (VENTOLIN HFA) 108 (90 Base) MCG/ACT inhaler Inhale 1-2 puffs into the lungs every 6 (six) hours as needed for wheezing or shortness of breath.    Marland Kitchen atorvastatin (LIPITOR) 40 MG tablet Take 40 mg by mouth daily.    Marland Kitchen buPROPion (WELLBUTRIN XL) 300 MG 24 hr tablet Take 300 mg by mouth daily.    . fluticasone (FLONASE) 50 MCG/ACT nasal spray Place 2 sprays into both nostrils 2 (two) times daily.    Marland Kitchen lamoTRIgine (LAMICTAL) 100 MG tablet Take 100 mg by mouth daily with breakfast.    . levothyroxine (SYNTHROID, LEVOTHROID) 125 MCG tablet Take 125 mcg by mouth daily before breakfast.    . metFORMIN (GLUCOPHAGE) 500 MG tablet Take 500 mg by mouth 2 (two) times daily.    Marland Kitchen oxyCODONE-acetaminophen (PERCOCET) 10-325  MG tablet Take 1 tablet by mouth every 8 (eight) hours as needed. For pain.  0  . pantoprazole (PROTONIX) 40 MG tablet Take 40 mg by mouth daily before breakfast.    . testosterone cypionate (DEPOTESTOSTERONE CYPIONATE) 200 MG/ML injection Inject 100 mg into the muscle See admin instructions. Every 10 days      Results for orders placed or performed during the hospital encounter of 07/09/19 (from the past 48 hour(s))  Glucose, capillary     Status: None   Collection Time: 07/09/19  7:27 PM  Result Value Ref Range   Glucose-Capillary 99 70 - 99 mg/dL   No results found.  Review of Systems  Constitutional: Negative for chills and fever.  Respiratory: Negative for cough.   Cardiovascular: Negative for chest pain.  Gastrointestinal: Positive for abdominal pain, constipation and nausea. Negative for  vomiting.  Genitourinary: Negative for dysuria.  Musculoskeletal: Negative for myalgias.  All other systems reviewed and are negative.   Blood pressure (!) 154/104, pulse 67, temperature 98.8 F (37.1 C), temperature source Oral, resp. rate 14, SpO2 99 %. Physical Exam  Constitutional: He is oriented to person, place, and time. He appears well-developed and well-nourished. No distress.  HENT:  Head: Normocephalic and atraumatic.  Right Ear: External ear normal.  Left Ear: External ear normal.  Nose: Nose normal.  Mouth/Throat: Oropharynx is clear and moist.  Eyes: Pupils are equal, round, and reactive to light. Right eye exhibits no discharge. Left eye exhibits no discharge. No scleral icterus.  Neck: Normal range of motion. No tracheal deviation present.  Cardiovascular: Normal rate, regular rhythm, normal heart sounds and intact distal pulses.  No murmur heard. Respiratory: Breath sounds normal. No respiratory distress.  GI: Soft. He exhibits no distension. There is abdominal tenderness.  There is very mild diffuse tenderness.  His abdomen is soft with no peritoneal signs and no distention.  There is minimal guarding.  Musculoskeletal: Normal range of motion.        General: No deformity or edema.  Neurological: He is alert and oriented to person, place, and time.  Skin: Skin is warm and dry. He is not diaphoretic.  Psychiatric: His behavior is normal. Judgment normal.     Assessment/Plan Small bowel obstruction  I only have the report of the CT scan and cannot review the images.  Clinically, he is improved since earlier today per his report.  At this point, without a nasogastric tube, I will hold on the small bowel protocol.  We will proceed with bowel rest and IV rehydration.  We will repeat his laboratory data in the morning.  We will notify one of our bariatric surgeons of his admission.  Because of his previous Roux-en-Y, if he is not improving he may require a diagnostic  laparoscopy.  I explained this to him and he is in agreement with our plan.  Coralie Keens, MD 07/09/2019, 7:37 PM

## 2019-07-10 ENCOUNTER — Encounter (HOSPITAL_COMMUNITY): Payer: Self-pay | Admitting: *Deleted

## 2019-07-10 ENCOUNTER — Inpatient Hospital Stay (HOSPITAL_COMMUNITY): Payer: Medicare Other

## 2019-07-10 LAB — CBC
HCT: 36.6 % — ABNORMAL LOW (ref 39.0–52.0)
Hemoglobin: 10.5 g/dL — ABNORMAL LOW (ref 13.0–17.0)
MCH: 20.8 pg — ABNORMAL LOW (ref 26.0–34.0)
MCHC: 28.7 g/dL — ABNORMAL LOW (ref 30.0–36.0)
MCV: 72.6 fL — ABNORMAL LOW (ref 80.0–100.0)
Platelets: 310 10*3/uL (ref 150–400)
RBC: 5.04 MIL/uL (ref 4.22–5.81)
RDW: 18.2 % — ABNORMAL HIGH (ref 11.5–15.5)
WBC: 10.9 10*3/uL — ABNORMAL HIGH (ref 4.0–10.5)
nRBC: 0 % (ref 0.0–0.2)

## 2019-07-10 LAB — BASIC METABOLIC PANEL
Anion gap: 8 (ref 5–15)
BUN: 11 mg/dL (ref 6–20)
CO2: 25 mmol/L (ref 22–32)
Calcium: 8.9 mg/dL (ref 8.9–10.3)
Chloride: 106 mmol/L (ref 98–111)
Creatinine, Ser: 0.84 mg/dL (ref 0.61–1.24)
GFR calc Af Amer: >60 mL/min (ref >60)
GFR calc non Af Amer: >60 mL/min (ref >60)
Glucose, Bld: 93 mg/dL (ref 70–99)
Potassium: 4.1 mmol/L (ref 3.5–5.1)
Sodium: 139 mmol/L (ref 135–145)

## 2019-07-10 LAB — GLUCOSE, CAPILLARY
Glucose-Capillary: 92 mg/dL (ref 70–99)
Glucose-Capillary: 89 mg/dL (ref 70–99)
Glucose-Capillary: 94 mg/dL (ref 70–99)
Glucose-Capillary: 93 mg/dL (ref 70–99)
Glucose-Capillary: 110 mg/dL — ABNORMAL HIGH (ref 70–99)
Glucose-Capillary: 99 mg/dL (ref 70–99)

## 2019-07-10 LAB — HIV ANTIBODY (ROUTINE TESTING W REFLEX): HIV Screen 4th Generation wRfx: NONREACTIVE

## 2019-07-10 MED ORDER — PNEUMOCOCCAL VAC POLYVALENT 25 MCG/0.5ML IJ INJ
0.5000 mL | INJECTION | INTRAMUSCULAR | Status: AC
  Administered 2019-07-11: 0.5 mL via INTRAMUSCULAR
  Filled 2019-07-10: qty 0.5

## 2019-07-10 NOTE — Progress Notes (Signed)
Progress Note: General Surgery Service   Chief Complaint/Subjective: +flatus, pain minimal, no nausea   Objective: Vital signs in last 24 hours: Temp:  [97.9 F (36.6 C)-98.8 F (37.1 C)] 98.7 F (37.1 C) (10/15 0513) Pulse Rate:  [61-74] 61 (10/15 0915) Resp:  [14] 14 (10/15 0513) BP: (140-154)/(90-104) 152/95 (10/15 0915) SpO2:  [96 %-99 %] 98 % (10/15 0914) Weight:  [93 kg] 93 kg (10/14 1939) Last BM Date: 07/09/19  Intake/Output from previous day: 10/14 0701 - 10/15 0700 In: 1282.6 [I.V.:1282.6] Out: 1700 [Urine:1700] Intake/Output this shift: No intake/output data recorded.  Gen: NAD  Resp: nonlabored  Card: RRR  Abd: soft, NT, ND  Lab Results: CBC  Recent Labs    07/10/19 0314  WBC 10.9*  HGB 10.5*  HCT 36.6*  PLT 310   BMET Recent Labs    07/10/19 0314  NA 139  K 4.1  CL 106  CO2 25  GLUCOSE 93  BUN 11  CREATININE 0.84  CALCIUM 8.9   PT/INR No results for input(s): LABPROT, INR in the last 72 hours. ABG No results for input(s): PHART, HCO3 in the last 72 hours.  Invalid input(s): PCO2, PO2  Studies/Results:  Anti-infectives: Anti-infectives (From admission, onward)   None      Medications: Scheduled Meds: . enoxaparin (LOVENOX) injection  40 mg Subcutaneous Q24H  . insulin aspart  0-15 Units Subcutaneous Q4H  . metoCLOPramide (REGLAN) injection  10 mg Intravenous Q8H   Continuous Infusions: . 0.9 % NaCl with KCl 20 mEq / L 125 mL/hr at 07/10/19 0414   PRN Meds:.albuterol, diphenhydrAMINE **OR** diphenhydrAMINE, morphine injection, ondansetron **OR** ondansetron (ZOFRAN) IV  Assessment/Plan: Patient Active Problem List   Diagnosis Date Noted  . SBO (small bowel obstruction) ([place]) 07/09/2019  . Morbid obesity ([place]) 03/19/2017   Small bowel obstruction -pain resolved and passing gas -liquids today  S/P Roux en Y gastric bypass -if does not tolerate liquids may do diagnostic laparoscopy tomorrow  Nicotine use -briefly  counseled on issues of nicotine use in Roux anatomy   LOS: 1 day   Mickeal Skinner, MD Pg# [phone] [facility]

## 2019-07-11 LAB — GLUCOSE, CAPILLARY
Glucose-Capillary: 93 mg/dL (ref 70–99)
Glucose-Capillary: 97 mg/dL (ref 70–99)
Glucose-Capillary: 131 mg/dL — ABNORMAL HIGH (ref 70–99)

## 2019-07-11 LAB — CBC
HCT: 37.8 % — ABNORMAL LOW (ref 39.0–52.0)
Hemoglobin: 11 g/dL — ABNORMAL LOW (ref 13.0–17.0)
MCH: 21.1 pg — ABNORMAL LOW (ref 26.0–34.0)
MCHC: 29.1 g/dL — ABNORMAL LOW (ref 30.0–36.0)
MCV: 72.4 fL — ABNORMAL LOW (ref 80.0–100.0)
Platelets: 306 10*3/uL (ref 150–400)
RBC: 5.22 MIL/uL (ref 4.22–5.81)
RDW: 18.7 % — ABNORMAL HIGH (ref 11.5–15.5)
WBC: 7.9 10*3/uL (ref 4.0–10.5)
nRBC: 0 % (ref 0.0–0.2)

## 2019-07-11 LAB — BASIC METABOLIC PANEL
Anion gap: 8 (ref 5–15)
BUN: 9 mg/dL (ref 6–20)
CO2: 26 mmol/L (ref 22–32)
Calcium: 9.2 mg/dL (ref 8.9–10.3)
Chloride: 103 mmol/L (ref 98–111)
Creatinine, Ser: 0.85 mg/dL (ref 0.61–1.24)
GFR calc Af Amer: >60 mL/min (ref >60)
GFR calc non Af Amer: >60 mL/min (ref >60)
Glucose, Bld: 92 mg/dL (ref 70–99)
Potassium: 4.1 mmol/L (ref 3.5–5.1)
Sodium: 137 mmol/L (ref 135–145)

## 2019-07-11 NOTE — Progress Notes (Signed)
Pt alert and oriented, tolerating diet. D/C instructions given, pt d/cd home. 

## 2019-07-11 NOTE — Progress Notes (Signed)
Progress Note: General Surgery Service   Chief Complaint/Subjective: Tolerating liquids, no nausea or pain, continued flatus   Objective: Vital signs in last 24 hours: Temp:  [97.8 F (36.6 C)-98.5 F (36.9 C)] 97.8 F (36.6 C) (10/16 0415) Pulse Rate:  [47-74] 47 (10/16 0415) Resp:  [16-18] 16 (10/16 0415) BP: (143-152)/(91-101) 144/91 (10/16 0415) SpO2:  [97 %-98 %] 97 % (10/16 0415) Last BM Date: 07/09/19  Intake/Output from previous day: 10/15 0701 - 10/16 0700 In: 4022.3 [P.O.:1320; I.V.:2702.3] Out: 3000 [Urine:3000] Intake/Output this shift: No intake/output data recorded.  Gen: NAD  Resp: nonlabored  Card: bradycardic  Abd: soft, NT, ND  Lab Results: CBC  Recent Labs    07/10/19 0314 07/11/19 0331  WBC 10.9* 7.9  HGB 10.5* 11.0*  HCT 36.6* 37.8*  PLT 310 306   BMET Recent Labs    07/10/19 0314 07/11/19 0331  NA 139 137  K 4.1 4.1  CL 106 103  CO2 25 26  GLUCOSE 93 92  BUN 11 9  CREATININE 0.84 0.85  CALCIUM 8.9 9.2   PT/INR No results for input(s): LABPROT, INR in the last 72 hours. ABG No results for input(s): PHART, HCO3 in the last 72 hours.  Invalid input(s): PCO2, PO2  Studies/Results:  Anti-infectives: Anti-infectives (From admission, onward)   None      Medications: Scheduled Meds: . enoxaparin (LOVENOX) injection  40 mg Subcutaneous Q24H  . insulin aspart  0-15 Units Subcutaneous Q4H  . pneumococcal 23 valent vaccine  0.5 mL Intramuscular Tomorrow-1000   Continuous Infusions: . 0.9 % NaCl with KCl 20 mEq / L 125 mL/hr at 07/11/19 0022   PRN Meds:.albuterol, diphenhydrAMINE **OR** diphenhydrAMINE, morphine injection, ondansetron **OR** ondansetron (ZOFRAN) IV  Assessment/Plan: Patient Active Problem List   Diagnosis Date Noted  . SBO (small bowel obstruction) ([place]) 07/09/2019  . Morbid obesity ([place]) 03/19/2017   Resolved SBO -advance diet -recheck this afternoon and likely home -f/u with me in 6-8  weeks     LOS: 2 days   Mickeal Skinner, MD Pg# [phone] [facility]

## 2019-07-11 NOTE — Plan of Care (Signed)
All goals met for d/c 

## 2019-07-11 NOTE — Discharge Summary (Signed)
Patient ID: Timothy Olson [id number] 1976/08/30 43 y.o.  Admit date: 07/09/2019 Discharge date: 07/11/2019  Admitting Diagnosis: SBO, s/p gastric bypass  Discharge Diagnosis Patient Active Problem List   Diagnosis Date Noted  . SBO (small bowel obstruction) ([place]) 07/09/2019  . Morbid obesity ([place]) 03/19/2017    Consultants none  Reason for Admission: This is a 43 year old gentleman transferred from [facility].  He presented there today with a 1 day history of sharp abdominal pain.  He had nausea but no vomiting.  His last bowel movement was this morning.  The pain started yesterday and is worse when he tries to bend.  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 Kieth Brightly.  He reports he has done mostly well until now.  He underwent a CAT scan of the abdomen and pelvis which showed dilated loops of bowel and a transition point which was difficult to exactly tell where given his anatomy.  They felt like it was the more distal small bowel.  He reports he was originally bloated but since arriving here he is feeling much better but still having some abdominal pain.  He reports his abdominal distention has regressed significantly and he has passed gas.  They made multiple attempts to place a nasogastric tube prior to transfer without success.  Procedures none  Hospital Course:  The patient was admitted for findings of an SBO on his CT scan, which was concerning given his history of gastric bypass.  By the time he was admitted he was starting to already feel better.  The next morning Dr. Kieth Brightly saw him, who did his bypass.  He was passing flatus with no abdominal pain. His diet was advanced to clear liquids.  He tolerated this well and was advanced to a soft diet the following day.  He was felt stable for DC home on the same diet after tolerating his soft diet.      Physical Exam: Gen: NAD  Resp: nonlabored  Card:  bradycardic  Abd: soft, NT, ND Per Dr. Amie [place] progress note today  Allergies as of 07/11/2019      Reactions   Gentamicin Swelling   Nsaids Swelling, Other (See Comments)   Legs swelling   Latex Itching, Rash, Other (See Comments)   Skin peels      Medication List    TAKE these medications   atorvastatin 40 MG tablet Commonly known as: LIPITOR Take 40 mg by mouth daily.   Bariatric Multivitamins/Iron Caps Take 1 tablet by mouth daily.   fluticasone 50 MCG/ACT nasal spray Commonly known as: FLONASE Place 2 sprays into both nostrils 2 (two) times daily.   lamoTRIgine 100 MG tablet Commonly known as: LAMICTAL Take 100 mg by mouth daily with breakfast.   levothyroxine 125 MCG tablet Commonly known as: SYNTHROID Take 125 mcg by mouth daily before breakfast.   oxyCODONE-acetaminophen 10-325 MG tablet Commonly known as: PERCOCET Take 1 tablet by mouth every 8 (eight) hours as needed for pain.   pantoprazole 40 MG tablet Commonly known as: PROTONIX Take 40 mg by mouth daily before breakfast.   testosterone cypionate 200 MG/ML injection Commonly known as: DEPOTESTOSTERONE CYPIONATE Inject 100 mg into the muscle See admin instructions. Every 10 days   Ventolin HFA 108 (90 Base) MCG/ACT inhaler Generic drug: albuterol Inhale 1-2 puffs into the lungs every 6 (six) hours as needed for wheezing or shortness of breath.  Follow-up Information    Kinsinger, Arta Bruce, MD Follow up in 6 week(s).   Specialty: General Surgery Contact information: [address]  [place] [address] [phone]           Signed: Saverio Danker, [facility] 07/11/2019, 12:31 PM Please see Amion for pager number during day hours 7:00am-4:30pm

## 2019-07-28 ENCOUNTER — Encounter: Payer: Self-pay | Admitting: Gastroenterology

## 2020-09-29 ENCOUNTER — Encounter (HOSPITAL_COMMUNITY): Payer: Self-pay

## 2022-08-30 DIAGNOSIS — F419 Anxiety disorder, unspecified: Secondary | ICD-10-CM | POA: Diagnosis not present

## 2022-08-30 DIAGNOSIS — K219 Gastro-esophageal reflux disease without esophagitis: Secondary | ICD-10-CM | POA: Diagnosis not present

## 2022-08-30 DIAGNOSIS — I10 Essential (primary) hypertension: Secondary | ICD-10-CM | POA: Diagnosis not present

## 2022-08-30 DIAGNOSIS — E1165 Type 2 diabetes mellitus with hyperglycemia: Secondary | ICD-10-CM | POA: Diagnosis not present

## 2022-08-30 DIAGNOSIS — J449 Chronic obstructive pulmonary disease, unspecified: Secondary | ICD-10-CM | POA: Diagnosis not present

## 2022-09-20 DIAGNOSIS — C680 Malignant neoplasm of urethra: Secondary | ICD-10-CM | POA: Diagnosis not present

## 2022-09-20 DIAGNOSIS — R188 Other ascites: Secondary | ICD-10-CM | POA: Diagnosis not present

## 2022-09-20 DIAGNOSIS — R918 Other nonspecific abnormal finding of lung field: Secondary | ICD-10-CM | POA: Diagnosis not present

## 2022-09-20 DIAGNOSIS — Z9889 Other specified postprocedural states: Secondary | ICD-10-CM | POA: Diagnosis not present

## 2022-09-20 DIAGNOSIS — Z9079 Acquired absence of other genital organ(s): Secondary | ICD-10-CM | POA: Diagnosis not present

## 2022-09-20 DIAGNOSIS — E041 Nontoxic single thyroid nodule: Secondary | ICD-10-CM | POA: Diagnosis not present

## 2022-09-20 DIAGNOSIS — Z936 Other artificial openings of urinary tract status: Secondary | ICD-10-CM | POA: Diagnosis not present

## 2022-09-20 DIAGNOSIS — Z95828 Presence of other vascular implants and grafts: Secondary | ICD-10-CM | POA: Diagnosis not present

## 2022-09-20 DIAGNOSIS — N2889 Other specified disorders of kidney and ureter: Secondary | ICD-10-CM | POA: Diagnosis not present

## 2022-09-20 DIAGNOSIS — H547 Unspecified visual loss: Secondary | ICD-10-CM | POA: Diagnosis not present

## 2022-09-20 DIAGNOSIS — Z906 Acquired absence of other parts of urinary tract: Secondary | ICD-10-CM | POA: Diagnosis not present

## 2022-09-24 DIAGNOSIS — I10 Essential (primary) hypertension: Secondary | ICD-10-CM | POA: Diagnosis not present

## 2022-09-24 DIAGNOSIS — E782 Mixed hyperlipidemia: Secondary | ICD-10-CM | POA: Diagnosis not present

## 2022-09-24 DIAGNOSIS — D649 Anemia, unspecified: Secondary | ICD-10-CM | POA: Diagnosis not present

## 2022-09-24 DIAGNOSIS — E038 Other specified hypothyroidism: Secondary | ICD-10-CM | POA: Diagnosis not present

## 2022-09-24 DIAGNOSIS — D518 Other vitamin B12 deficiency anemias: Secondary | ICD-10-CM | POA: Diagnosis not present

## 2022-09-24 DIAGNOSIS — M4726 Other spondylosis with radiculopathy, lumbar region: Secondary | ICD-10-CM | POA: Diagnosis not present

## 2022-09-24 DIAGNOSIS — E559 Vitamin D deficiency, unspecified: Secondary | ICD-10-CM | POA: Diagnosis not present

## 2022-09-24 DIAGNOSIS — J4540 Moderate persistent asthma, uncomplicated: Secondary | ICD-10-CM | POA: Diagnosis not present

## 2022-09-24 DIAGNOSIS — E1165 Type 2 diabetes mellitus with hyperglycemia: Secondary | ICD-10-CM | POA: Diagnosis not present

## 2022-09-27 DIAGNOSIS — E782 Mixed hyperlipidemia: Secondary | ICD-10-CM | POA: Diagnosis not present

## 2022-09-27 DIAGNOSIS — K219 Gastro-esophageal reflux disease without esophagitis: Secondary | ICD-10-CM | POA: Diagnosis not present

## 2022-09-27 DIAGNOSIS — F419 Anxiety disorder, unspecified: Secondary | ICD-10-CM | POA: Diagnosis not present

## 2022-09-27 DIAGNOSIS — Z6841 Body Mass Index (BMI) 40.0 and over, adult: Secondary | ICD-10-CM | POA: Diagnosis not present

## 2022-09-27 DIAGNOSIS — I10 Essential (primary) hypertension: Secondary | ICD-10-CM | POA: Diagnosis not present

## 2022-09-27 DIAGNOSIS — E1165 Type 2 diabetes mellitus with hyperglycemia: Secondary | ICD-10-CM | POA: Diagnosis not present

## 2022-10-13 ENCOUNTER — Encounter (HOSPITAL_COMMUNITY): Payer: Self-pay | Admitting: *Deleted

## 2022-10-18 DIAGNOSIS — N1330 Unspecified hydronephrosis: Secondary | ICD-10-CM | POA: Diagnosis not present

## 2022-10-18 DIAGNOSIS — C680 Malignant neoplasm of urethra: Secondary | ICD-10-CM | POA: Diagnosis not present

## 2022-10-18 DIAGNOSIS — Z436 Encounter for attention to other artificial openings of urinary tract: Secondary | ICD-10-CM | POA: Diagnosis not present

## 2022-10-18 DIAGNOSIS — R7309 Other abnormal glucose: Secondary | ICD-10-CM | POA: Diagnosis not present

## 2022-10-24 DIAGNOSIS — Z432 Encounter for attention to ileostomy: Secondary | ICD-10-CM | POA: Diagnosis not present

## 2022-10-25 DIAGNOSIS — M4726 Other spondylosis with radiculopathy, lumbar region: Secondary | ICD-10-CM | POA: Diagnosis not present

## 2022-10-25 DIAGNOSIS — E038 Other specified hypothyroidism: Secondary | ICD-10-CM | POA: Diagnosis not present

## 2022-10-25 DIAGNOSIS — E782 Mixed hyperlipidemia: Secondary | ICD-10-CM | POA: Diagnosis not present

## 2022-10-25 DIAGNOSIS — E1165 Type 2 diabetes mellitus with hyperglycemia: Secondary | ICD-10-CM | POA: Diagnosis not present

## 2022-10-25 DIAGNOSIS — D518 Other vitamin B12 deficiency anemias: Secondary | ICD-10-CM | POA: Diagnosis not present

## 2022-10-25 DIAGNOSIS — I10 Essential (primary) hypertension: Secondary | ICD-10-CM | POA: Diagnosis not present

## 2022-10-25 DIAGNOSIS — D649 Anemia, unspecified: Secondary | ICD-10-CM | POA: Diagnosis not present

## 2022-10-25 DIAGNOSIS — J4540 Moderate persistent asthma, uncomplicated: Secondary | ICD-10-CM | POA: Diagnosis not present

## 2022-10-25 DIAGNOSIS — E559 Vitamin D deficiency, unspecified: Secondary | ICD-10-CM | POA: Diagnosis not present

## 2022-10-26 DIAGNOSIS — Z20828 Contact with and (suspected) exposure to other viral communicable diseases: Secondary | ICD-10-CM | POA: Diagnosis not present

## 2022-10-26 DIAGNOSIS — U071 COVID-19: Secondary | ICD-10-CM | POA: Diagnosis not present

## 2022-10-26 DIAGNOSIS — E782 Mixed hyperlipidemia: Secondary | ICD-10-CM | POA: Diagnosis not present

## 2022-10-26 DIAGNOSIS — F419 Anxiety disorder, unspecified: Secondary | ICD-10-CM | POA: Diagnosis not present

## 2022-10-26 DIAGNOSIS — E1165 Type 2 diabetes mellitus with hyperglycemia: Secondary | ICD-10-CM | POA: Diagnosis not present

## 2022-10-26 DIAGNOSIS — I10 Essential (primary) hypertension: Secondary | ICD-10-CM | POA: Diagnosis not present

## 2022-10-26 DIAGNOSIS — R509 Fever, unspecified: Secondary | ICD-10-CM | POA: Diagnosis not present

## 2022-10-26 DIAGNOSIS — Z5181 Encounter for therapeutic drug level monitoring: Secondary | ICD-10-CM | POA: Diagnosis not present

## 2022-11-23 DIAGNOSIS — E291 Testicular hypofunction: Secondary | ICD-10-CM | POA: Diagnosis not present

## 2022-11-23 DIAGNOSIS — D518 Other vitamin B12 deficiency anemias: Secondary | ICD-10-CM | POA: Diagnosis not present

## 2022-11-23 DIAGNOSIS — J4540 Moderate persistent asthma, uncomplicated: Secondary | ICD-10-CM | POA: Diagnosis not present

## 2022-11-23 DIAGNOSIS — D649 Anemia, unspecified: Secondary | ICD-10-CM | POA: Diagnosis not present

## 2022-11-23 DIAGNOSIS — M4726 Other spondylosis with radiculopathy, lumbar region: Secondary | ICD-10-CM | POA: Diagnosis not present

## 2022-11-23 DIAGNOSIS — E559 Vitamin D deficiency, unspecified: Secondary | ICD-10-CM | POA: Diagnosis not present

## 2022-11-23 DIAGNOSIS — E1165 Type 2 diabetes mellitus with hyperglycemia: Secondary | ICD-10-CM | POA: Diagnosis not present

## 2022-11-23 DIAGNOSIS — I10 Essential (primary) hypertension: Secondary | ICD-10-CM | POA: Diagnosis not present

## 2022-11-23 DIAGNOSIS — F419 Anxiety disorder, unspecified: Secondary | ICD-10-CM | POA: Diagnosis not present

## 2022-11-23 DIAGNOSIS — E782 Mixed hyperlipidemia: Secondary | ICD-10-CM | POA: Diagnosis not present

## 2022-11-23 DIAGNOSIS — E038 Other specified hypothyroidism: Secondary | ICD-10-CM | POA: Diagnosis not present

## 2022-11-23 DIAGNOSIS — C679 Malignant neoplasm of bladder, unspecified: Secondary | ICD-10-CM | POA: Diagnosis not present

## 2022-11-23 DIAGNOSIS — F319 Bipolar disorder, unspecified: Secondary | ICD-10-CM | POA: Diagnosis not present

## 2022-12-06 DIAGNOSIS — I10 Essential (primary) hypertension: Secondary | ICD-10-CM | POA: Diagnosis not present

## 2022-12-06 DIAGNOSIS — M4726 Other spondylosis with radiculopathy, lumbar region: Secondary | ICD-10-CM | POA: Diagnosis not present

## 2022-12-06 DIAGNOSIS — D518 Other vitamin B12 deficiency anemias: Secondary | ICD-10-CM | POA: Diagnosis not present

## 2022-12-06 DIAGNOSIS — F3489 Other specified persistent mood disorders: Secondary | ICD-10-CM | POA: Diagnosis not present

## 2022-12-06 DIAGNOSIS — E1165 Type 2 diabetes mellitus with hyperglycemia: Secondary | ICD-10-CM | POA: Diagnosis not present

## 2022-12-06 DIAGNOSIS — J4540 Moderate persistent asthma, uncomplicated: Secondary | ICD-10-CM | POA: Diagnosis not present

## 2022-12-06 DIAGNOSIS — E038 Other specified hypothyroidism: Secondary | ICD-10-CM | POA: Diagnosis not present

## 2022-12-06 DIAGNOSIS — E559 Vitamin D deficiency, unspecified: Secondary | ICD-10-CM | POA: Diagnosis not present

## 2022-12-06 DIAGNOSIS — E782 Mixed hyperlipidemia: Secondary | ICD-10-CM | POA: Diagnosis not present

## 2022-12-13 DIAGNOSIS — Z936 Other artificial openings of urinary tract status: Secondary | ICD-10-CM | POA: Diagnosis not present

## 2022-12-13 DIAGNOSIS — C680 Malignant neoplasm of urethra: Secondary | ICD-10-CM | POA: Diagnosis not present

## 2022-12-18 DIAGNOSIS — N200 Calculus of kidney: Secondary | ICD-10-CM | POA: Diagnosis not present

## 2022-12-20 DIAGNOSIS — I10 Essential (primary) hypertension: Secondary | ICD-10-CM | POA: Diagnosis not present

## 2022-12-20 DIAGNOSIS — E038 Other specified hypothyroidism: Secondary | ICD-10-CM | POA: Diagnosis not present

## 2022-12-20 DIAGNOSIS — K219 Gastro-esophageal reflux disease without esophagitis: Secondary | ICD-10-CM | POA: Diagnosis not present

## 2022-12-20 DIAGNOSIS — F419 Anxiety disorder, unspecified: Secondary | ICD-10-CM | POA: Diagnosis not present

## 2022-12-20 DIAGNOSIS — E1165 Type 2 diabetes mellitus with hyperglycemia: Secondary | ICD-10-CM | POA: Diagnosis not present

## 2022-12-20 DIAGNOSIS — J449 Chronic obstructive pulmonary disease, unspecified: Secondary | ICD-10-CM | POA: Diagnosis not present

## 2022-12-27 DIAGNOSIS — L02818 Cutaneous abscess of other sites: Secondary | ICD-10-CM | POA: Diagnosis not present

## 2022-12-27 DIAGNOSIS — Z906 Acquired absence of other parts of urinary tract: Secondary | ICD-10-CM | POA: Diagnosis not present

## 2022-12-27 DIAGNOSIS — K5530 Necrotizing enterocolitis, unspecified: Secondary | ICD-10-CM | POA: Diagnosis not present

## 2022-12-27 DIAGNOSIS — C680 Malignant neoplasm of urethra: Secondary | ICD-10-CM | POA: Diagnosis not present

## 2022-12-27 DIAGNOSIS — Z9229 Personal history of other drug therapy: Secondary | ICD-10-CM | POA: Diagnosis not present

## 2022-12-27 DIAGNOSIS — Z483 Aftercare following surgery for neoplasm: Secondary | ICD-10-CM | POA: Diagnosis not present

## 2022-12-27 DIAGNOSIS — Z9079 Acquired absence of other genital organ(s): Secondary | ICD-10-CM | POA: Diagnosis not present

## 2022-12-27 DIAGNOSIS — R918 Other nonspecific abnormal finding of lung field: Secondary | ICD-10-CM | POA: Diagnosis not present

## 2022-12-27 DIAGNOSIS — Z8719 Personal history of other diseases of the digestive system: Secondary | ICD-10-CM | POA: Diagnosis not present

## 2023-01-10 DIAGNOSIS — F419 Anxiety disorder, unspecified: Secondary | ICD-10-CM | POA: Diagnosis not present

## 2023-01-16 DIAGNOSIS — J4540 Moderate persistent asthma, uncomplicated: Secondary | ICD-10-CM | POA: Diagnosis not present

## 2023-01-16 DIAGNOSIS — F3489 Other specified persistent mood disorders: Secondary | ICD-10-CM | POA: Diagnosis not present

## 2023-01-16 DIAGNOSIS — E1165 Type 2 diabetes mellitus with hyperglycemia: Secondary | ICD-10-CM | POA: Diagnosis not present

## 2023-01-16 DIAGNOSIS — E559 Vitamin D deficiency, unspecified: Secondary | ICD-10-CM | POA: Diagnosis not present

## 2023-01-16 DIAGNOSIS — M4726 Other spondylosis with radiculopathy, lumbar region: Secondary | ICD-10-CM | POA: Diagnosis not present

## 2023-01-16 DIAGNOSIS — D518 Other vitamin B12 deficiency anemias: Secondary | ICD-10-CM | POA: Diagnosis not present

## 2023-01-16 DIAGNOSIS — I10 Essential (primary) hypertension: Secondary | ICD-10-CM | POA: Diagnosis not present

## 2023-01-16 DIAGNOSIS — E782 Mixed hyperlipidemia: Secondary | ICD-10-CM | POA: Diagnosis not present

## 2023-01-16 DIAGNOSIS — E038 Other specified hypothyroidism: Secondary | ICD-10-CM | POA: Diagnosis not present

## 2023-01-17 DIAGNOSIS — I10 Essential (primary) hypertension: Secondary | ICD-10-CM | POA: Diagnosis not present

## 2023-01-17 DIAGNOSIS — E782 Mixed hyperlipidemia: Secondary | ICD-10-CM | POA: Diagnosis not present

## 2023-01-17 DIAGNOSIS — E038 Other specified hypothyroidism: Secondary | ICD-10-CM | POA: Diagnosis not present

## 2023-01-17 DIAGNOSIS — E1165 Type 2 diabetes mellitus with hyperglycemia: Secondary | ICD-10-CM | POA: Diagnosis not present

## 2023-01-17 DIAGNOSIS — F419 Anxiety disorder, unspecified: Secondary | ICD-10-CM | POA: Diagnosis not present

## 2023-02-02 DIAGNOSIS — Z936 Other artificial openings of urinary tract status: Secondary | ICD-10-CM | POA: Diagnosis not present

## 2023-02-02 DIAGNOSIS — N135 Crossing vessel and stricture of ureter without hydronephrosis: Secondary | ICD-10-CM | POA: Diagnosis not present

## 2023-02-02 DIAGNOSIS — C680 Malignant neoplasm of urethra: Secondary | ICD-10-CM | POA: Diagnosis not present

## 2023-02-09 DIAGNOSIS — M4726 Other spondylosis with radiculopathy, lumbar region: Secondary | ICD-10-CM | POA: Diagnosis not present

## 2023-02-09 DIAGNOSIS — J4540 Moderate persistent asthma, uncomplicated: Secondary | ICD-10-CM | POA: Diagnosis not present

## 2023-02-09 DIAGNOSIS — D518 Other vitamin B12 deficiency anemias: Secondary | ICD-10-CM | POA: Diagnosis not present

## 2023-02-09 DIAGNOSIS — E038 Other specified hypothyroidism: Secondary | ICD-10-CM | POA: Diagnosis not present

## 2023-02-09 DIAGNOSIS — E559 Vitamin D deficiency, unspecified: Secondary | ICD-10-CM | POA: Diagnosis not present

## 2023-02-09 DIAGNOSIS — F3489 Other specified persistent mood disorders: Secondary | ICD-10-CM | POA: Diagnosis not present

## 2023-02-09 DIAGNOSIS — E1165 Type 2 diabetes mellitus with hyperglycemia: Secondary | ICD-10-CM | POA: Diagnosis not present

## 2023-02-09 DIAGNOSIS — E782 Mixed hyperlipidemia: Secondary | ICD-10-CM | POA: Diagnosis not present

## 2023-02-09 DIAGNOSIS — I10 Essential (primary) hypertension: Secondary | ICD-10-CM | POA: Diagnosis not present

## 2023-02-15 DIAGNOSIS — F419 Anxiety disorder, unspecified: Secondary | ICD-10-CM | POA: Diagnosis not present

## 2023-02-16 DIAGNOSIS — J449 Chronic obstructive pulmonary disease, unspecified: Secondary | ICD-10-CM | POA: Diagnosis not present

## 2023-02-16 DIAGNOSIS — E038 Other specified hypothyroidism: Secondary | ICD-10-CM | POA: Diagnosis not present

## 2023-02-16 DIAGNOSIS — F419 Anxiety disorder, unspecified: Secondary | ICD-10-CM | POA: Diagnosis not present

## 2023-02-16 DIAGNOSIS — I10 Essential (primary) hypertension: Secondary | ICD-10-CM | POA: Diagnosis not present

## 2023-02-16 DIAGNOSIS — K219 Gastro-esophageal reflux disease without esophagitis: Secondary | ICD-10-CM | POA: Diagnosis not present

## 2023-02-16 DIAGNOSIS — E1165 Type 2 diabetes mellitus with hyperglycemia: Secondary | ICD-10-CM | POA: Diagnosis not present

## 2023-10-18 ENCOUNTER — Encounter (HOSPITAL_COMMUNITY): Payer: Self-pay | Admitting: *Deleted
